# Patient Record
Sex: Male | Born: 1965 | Race: Black or African American | Hispanic: No | Marital: Married | Smoking: Never smoker
Health system: Southern US, Community
[De-identification: ages and names within clinical notes are randomized; demographics above are authoritative.]

## PROBLEM LIST (undated history)

## (undated) DIAGNOSIS — E8881 Metabolic syndrome: Secondary | ICD-10-CM

## (undated) DIAGNOSIS — R319 Hematuria, unspecified: Secondary | ICD-10-CM

## (undated) DIAGNOSIS — E119 Type 2 diabetes mellitus without complications: Secondary | ICD-10-CM

## (undated) DIAGNOSIS — I82409 Acute embolism and thrombosis of unspecified deep veins of unspecified lower extremity: Secondary | ICD-10-CM

## (undated) DIAGNOSIS — I2699 Other pulmonary embolism without acute cor pulmonale: Secondary | ICD-10-CM

## (undated) DIAGNOSIS — I10 Essential (primary) hypertension: Secondary | ICD-10-CM

## (undated) HISTORY — DX: Hematuria, unspecified: R31.9

## (undated) HISTORY — DX: Essential (primary) hypertension: I10

## (undated) HISTORY — DX: Other pulmonary embolism without acute cor pulmonale: I26.99

## (undated) HISTORY — DX: Type 2 diabetes mellitus without complications: E11.9

## (undated) HISTORY — PX: NO PAST SURGERIES: SHX2092

---

## 1999-08-05 ENCOUNTER — Emergency Department (HOSPITAL_COMMUNITY): Admission: EM | Admit: 1999-08-05 | Discharge: 1999-08-05 | Payer: Self-pay | Admitting: Emergency Medicine

## 2005-01-24 ENCOUNTER — Ambulatory Visit: Payer: Self-pay | Admitting: Family Medicine

## 2005-02-22 ENCOUNTER — Ambulatory Visit: Payer: Self-pay | Admitting: Family Medicine

## 2005-02-25 ENCOUNTER — Ambulatory Visit: Payer: Self-pay | Admitting: Family Medicine

## 2005-07-26 ENCOUNTER — Ambulatory Visit: Payer: Self-pay | Admitting: Family Medicine

## 2005-08-02 ENCOUNTER — Ambulatory Visit: Payer: Self-pay | Admitting: Family Medicine

## 2005-08-19 ENCOUNTER — Ambulatory Visit: Payer: Self-pay | Admitting: Family Medicine

## 2005-09-05 ENCOUNTER — Ambulatory Visit: Payer: Self-pay | Admitting: Family Medicine

## 2005-09-20 ENCOUNTER — Ambulatory Visit: Payer: Self-pay | Admitting: Family Medicine

## 2005-10-31 ENCOUNTER — Ambulatory Visit: Payer: Self-pay | Admitting: Family Medicine

## 2005-10-31 ENCOUNTER — Encounter: Admission: RE | Admit: 2005-10-31 | Discharge: 2005-10-31 | Payer: Self-pay | Admitting: Family Medicine

## 2005-12-20 ENCOUNTER — Ambulatory Visit: Payer: Self-pay

## 2005-12-20 ENCOUNTER — Ambulatory Visit: Payer: Self-pay | Admitting: Family Medicine

## 2006-01-29 ENCOUNTER — Ambulatory Visit: Payer: Self-pay | Admitting: Family Medicine

## 2006-07-22 ENCOUNTER — Ambulatory Visit: Payer: Self-pay | Admitting: Family Medicine

## 2006-07-28 ENCOUNTER — Ambulatory Visit: Payer: Self-pay | Admitting: Family Medicine

## 2006-08-28 ENCOUNTER — Ambulatory Visit: Payer: Self-pay | Admitting: Family Medicine

## 2006-12-05 ENCOUNTER — Ambulatory Visit: Payer: Self-pay | Admitting: Family Medicine

## 2008-02-15 ENCOUNTER — Ambulatory Visit: Payer: Self-pay | Admitting: Family Medicine

## 2008-02-15 DIAGNOSIS — Z86718 Personal history of other venous thrombosis and embolism: Secondary | ICD-10-CM | POA: Insufficient documentation

## 2008-02-15 DIAGNOSIS — E785 Hyperlipidemia, unspecified: Secondary | ICD-10-CM | POA: Insufficient documentation

## 2008-07-11 DIAGNOSIS — H60399 Other infective otitis externa, unspecified ear: Secondary | ICD-10-CM | POA: Insufficient documentation

## 2008-07-13 ENCOUNTER — Ambulatory Visit: Payer: Self-pay | Admitting: Family Medicine

## 2008-08-26 ENCOUNTER — Telehealth: Payer: Self-pay | Admitting: Internal Medicine

## 2008-09-01 ENCOUNTER — Ambulatory Visit: Payer: Self-pay | Admitting: Family Medicine

## 2008-09-01 LAB — CONVERTED CEMR LAB
ALT: 25 units/L (ref 0–53)
AST: 24 units/L (ref 0–37)
Albumin: 4 g/dL (ref 3.5–5.2)
Alkaline Phosphatase: 52 units/L (ref 39–117)
BUN: 12 mg/dL (ref 6–23)
Basophils Absolute: 0 10*3/uL (ref 0.0–0.1)
Basophils Relative: 0.5 % (ref 0.0–3.0)
Bilirubin Urine: NEGATIVE
Bilirubin, Direct: 0.1 mg/dL (ref 0.0–0.3)
CO2: 31 meq/L (ref 19–32)
Calcium: 9.6 mg/dL (ref 8.4–10.5)
Chloride: 100 meq/L (ref 96–112)
Cholesterol: 179 mg/dL (ref 0–200)
Creatinine, Ser: 1.3 mg/dL (ref 0.4–1.5)
Eosinophils Absolute: 0.4 10*3/uL (ref 0.0–0.7)
Eosinophils Relative: 6.5 % — ABNORMAL HIGH (ref 0.0–5.0)
GFR calc Af Amer: 78 mL/min
GFR calc non Af Amer: 65 mL/min
Glucose, Bld: 94 mg/dL (ref 70–99)
Glucose, Urine, Semiquant: NEGATIVE
HCT: 38.2 % — ABNORMAL LOW (ref 39.0–52.0)
HDL: 49.8 mg/dL (ref >39.0)
Hemoglobin: 12.8 g/dL — ABNORMAL LOW (ref 13.0–17.0)
Ketones, urine, test strip: NEGATIVE
LDL Cholesterol: 110 mg/dL — ABNORMAL HIGH (ref 0–99)
Lymphocytes Relative: 47.2 % — ABNORMAL HIGH (ref 12.0–46.0)
MCHC: 33.5 g/dL (ref 30.0–36.0)
MCV: 85.7 fL (ref 78.0–100.0)
Monocytes Absolute: 0.4 10*3/uL (ref 0.1–1.0)
Monocytes Relative: 7.5 % (ref 3.0–12.0)
Neutro Abs: 2.1 10*3/uL (ref 1.4–7.7)
Neutrophils Relative %: 38.3 % — ABNORMAL LOW (ref 43.0–77.0)
Nitrite: NEGATIVE
PSA: 0.67 ng/mL (ref 0.10–4.00)
Platelets: 308 10*3/uL (ref 150–400)
Potassium: 3.5 meq/L (ref 3.5–5.1)
Protein, U semiquant: NEGATIVE
RBC: 4.46 M/uL (ref 4.22–5.81)
RDW: 13.2 % (ref 11.5–14.6)
Sodium: 137 meq/L (ref 135–145)
Specific Gravity, Urine: 1.015
TSH: 1 microintl units/mL (ref 0.35–5.50)
Total Bilirubin: 1 mg/dL (ref 0.3–1.2)
Total CHOL/HDL Ratio: 3.6
Total Protein: 7 g/dL (ref 6.0–8.3)
Triglycerides: 94 mg/dL (ref 0–149)
Urobilinogen, UA: 0.2
VLDL: 19 mg/dL (ref 0–40)
WBC Urine, dipstick: NEGATIVE
WBC: 5.6 10*3/uL (ref 4.5–10.5)
pH: 6.5

## 2008-09-08 ENCOUNTER — Ambulatory Visit: Payer: Self-pay | Admitting: Family Medicine

## 2009-02-07 ENCOUNTER — Telehealth: Payer: Self-pay | Admitting: Family Medicine

## 2009-02-10 ENCOUNTER — Ambulatory Visit: Payer: Self-pay | Admitting: Family Medicine

## 2009-02-10 DIAGNOSIS — R609 Edema, unspecified: Secondary | ICD-10-CM

## 2009-02-20 ENCOUNTER — Ambulatory Visit: Payer: Self-pay | Admitting: Vascular Surgery

## 2009-02-20 ENCOUNTER — Encounter: Payer: Self-pay | Admitting: Family Medicine

## 2009-03-02 ENCOUNTER — Ambulatory Visit: Payer: Self-pay | Admitting: Vascular Surgery

## 2010-03-01 ENCOUNTER — Ambulatory Visit: Payer: Self-pay | Admitting: Family Medicine

## 2010-03-05 ENCOUNTER — Ambulatory Visit: Payer: Self-pay | Admitting: Family Medicine

## 2010-03-21 ENCOUNTER — Ambulatory Visit: Payer: Self-pay | Admitting: Family Medicine

## 2010-03-21 LAB — CONVERTED CEMR LAB
ALT: 22 units/L (ref 0–53)
AST: 26 units/L (ref 0–37)
Albumin: 4.2 g/dL (ref 3.5–5.2)
Alkaline Phosphatase: 69 units/L (ref 39–117)
BUN: 21 mg/dL (ref 6–23)
Basophils Absolute: 0.1 10*3/uL (ref 0.0–0.1)
Basophils Relative: 1 % (ref 0.0–3.0)
Bilirubin Urine: NEGATIVE
Bilirubin, Direct: 0.2 mg/dL (ref 0.0–0.3)
Blood in Urine, dipstick: NEGATIVE
CO2: 28 meq/L (ref 19–32)
Calcium: 9.5 mg/dL (ref 8.4–10.5)
Chloride: 103 meq/L (ref 96–112)
Cholesterol: 165 mg/dL (ref 0–200)
Creatinine, Ser: 1.4 mg/dL (ref 0.4–1.5)
Eosinophils Absolute: 0.3 10*3/uL (ref 0.0–0.7)
Eosinophils Relative: 5.5 % — ABNORMAL HIGH (ref 0.0–5.0)
GFR calc non Af Amer: 69.33 mL/min (ref >60)
Glucose, Bld: 115 mg/dL — ABNORMAL HIGH (ref 70–99)
Glucose, Urine, Semiquant: NEGATIVE
HCT: 37.4 % — ABNORMAL LOW (ref 39.0–52.0)
HDL: 51.9 mg/dL (ref >39.00)
Hemoglobin: 12.5 g/dL — ABNORMAL LOW (ref 13.0–17.0)
Ketones, urine, test strip: NEGATIVE
LDL Cholesterol: 94 mg/dL (ref 0–99)
Lymphocytes Relative: 40.7 % (ref 12.0–46.0)
Lymphs Abs: 2.4 10*3/uL (ref 0.7–4.0)
MCHC: 33.3 g/dL (ref 30.0–36.0)
MCV: 84.5 fL (ref 78.0–100.0)
Monocytes Absolute: 0.5 10*3/uL (ref 0.1–1.0)
Monocytes Relative: 8.7 % (ref 3.0–12.0)
Neutro Abs: 2.6 10*3/uL (ref 1.4–7.7)
Neutrophils Relative %: 44.1 % (ref 43.0–77.0)
Nitrite: NEGATIVE
PSA: 0.75 ng/mL (ref 0.10–4.00)
Platelets: 397 10*3/uL (ref 150.0–400.0)
Potassium: 4.6 meq/L (ref 3.5–5.1)
RBC: 4.42 M/uL (ref 4.22–5.81)
RDW: 14.7 % — ABNORMAL HIGH (ref 11.5–14.6)
Sodium: 139 meq/L (ref 135–145)
Specific Gravity, Urine: 1.025
TSH: 0.51 microintl units/mL (ref 0.35–5.50)
Total Bilirubin: 1.2 mg/dL (ref 0.3–1.2)
Total CHOL/HDL Ratio: 3
Total Protein: 7.3 g/dL (ref 6.0–8.3)
Triglycerides: 98 mg/dL (ref 0.0–149.0)
Urobilinogen, UA: 0.2
VLDL: 19.6 mg/dL (ref 0.0–40.0)
WBC Urine, dipstick: NEGATIVE
WBC: 5.9 10*3/uL (ref 4.5–10.5)
pH: 5.5

## 2010-03-28 ENCOUNTER — Ambulatory Visit: Payer: Self-pay | Admitting: Family Medicine

## 2010-06-13 ENCOUNTER — Ambulatory Visit: Payer: Self-pay | Admitting: Family Medicine

## 2010-10-19 ENCOUNTER — Ambulatory Visit
Admission: RE | Admit: 2010-10-19 | Discharge: 2010-10-19 | Payer: Self-pay | Source: Home / Self Care | Attending: Family Medicine | Admitting: Family Medicine

## 2010-10-19 LAB — CONVERTED CEMR LAB
Bilirubin Urine: NEGATIVE
Nitrite: NEGATIVE
Urobilinogen, UA: 0.2
WBC Urine, dipstick: NEGATIVE

## 2010-10-26 ENCOUNTER — Telehealth: Payer: Self-pay | Admitting: Family Medicine

## 2010-10-26 ENCOUNTER — Other Ambulatory Visit: Payer: Self-pay | Admitting: Family Medicine

## 2010-10-26 ENCOUNTER — Ambulatory Visit
Admission: RE | Admit: 2010-10-26 | Discharge: 2010-10-26 | Payer: Self-pay | Source: Home / Self Care | Attending: Family Medicine | Admitting: Family Medicine

## 2010-10-26 LAB — MICROALBUMIN / CREATININE URINE RATIO
Creatinine,U: 101.8 mg/dL
Microalb Creat Ratio: 2.9 mg/g (ref 0.0–30.0)
Microalb, Ur: 3 mg/dL — ABNORMAL HIGH (ref 0.0–1.9)

## 2010-10-26 LAB — BASIC METABOLIC PANEL
BUN: 18 mg/dL (ref 6–23)
CO2: 28 mEq/L (ref 19–32)
Calcium: 10 mg/dL (ref 8.4–10.5)
Chloride: 92 mEq/L — ABNORMAL LOW (ref 96–112)
Creatinine, Ser: 1.3 mg/dL (ref 0.4–1.5)
GFR: 76.49 mL/min (ref >60.00)
Glucose, Bld: 267 mg/dL — ABNORMAL HIGH (ref 70–99)
Potassium: 4.7 mEq/L (ref 3.5–5.1)
Sodium: 131 mEq/L — ABNORMAL LOW (ref 135–145)

## 2010-10-26 LAB — HEMOGLOBIN A1C: Hgb A1c MFr Bld: 11.2 % — ABNORMAL HIGH (ref 4.6–6.5)

## 2010-11-01 ENCOUNTER — Ambulatory Visit
Admission: RE | Admit: 2010-11-01 | Discharge: 2010-11-01 | Payer: Self-pay | Source: Home / Self Care | Attending: Family Medicine | Admitting: Family Medicine

## 2010-11-07 ENCOUNTER — Telehealth: Payer: Self-pay | Admitting: Family Medicine

## 2010-11-15 ENCOUNTER — Ambulatory Visit
Admission: RE | Admit: 2010-11-15 | Discharge: 2010-11-15 | Payer: Self-pay | Source: Home / Self Care | Attending: Family Medicine | Admitting: Family Medicine

## 2010-11-20 NOTE — Assessment & Plan Note (Signed)
Summary: BACK PAINS/CONGESTION/RCD   Vital Signs:  Patient profile:   45 year old male Height:      72 inches Weight:      254 pounds BMI:     34.57 Temp:     98.3 degrees F oral BP sitting:   180 / 100  (left arm) Cuff size:   regular  Vitals Entered By: Kern Reap CMA Duncan Dull) (Mar 01, 2010 9:49 AM) CC: lower back pain and sinus pressure   CC:  lower back pain and sinus pressure.  History of Present Illness: Joshua Castaneda is a 45 year old, married male, nonsmoker, who comes in today for evaluation of low back pain.  He states over this past weekend.  He was doing some yard work and had some low back pain, but it was minimal.  Yesterday after work he bent over at home to lift something up and developed a sudden onset of severe pain.  He says his right and left lumbar.  It does not radiate down his legs.  He describes the pain as constant, sharp, sometimes dull, an 8 on a scale of one to 10, he did not sleep last night because of severe pain.  Five years ago.  He had an episode like this which resolved with bed rest and medication.  Review of systems negative specifically, bowel, bladder function, normal  Allergies: 1)  ! Shrimp Flavor (Flavoring Agent)  Social History: Reviewed history from 02/15/2008 and no changes required. Occupation:[employer] Never Smoked Alcohol use-no Drug use-no Regular exercise-yes  Review of Systems      See HPI  Physical Exam  General:  Well-developed,well-nourished,in no acute distress; alert,appropriate and cooperative throughout examination Msk:  No deformity or scoliosis noted of thoracic or lumbar spine.   Pulses:  R and L carotid,radial,femoral,dorsalis pedis and posterior tibial pulses are full and equal bilaterally Extremities:  No clubbing, cyanosis, edema, or deformity noted with normal full range of motion of all joints.   Neurologic:  No cranial nerve deficits noted. Station and gait are normal. Plantar reflexes are  down-going bilaterally. DTRs are symmetrical throughout. Sensory, motor and coordinative functions appear intact.   Impression & Recommendations:  Problem # 1:  LOW BACK PAIN, ACUTE (ICD-724.2) Assessment Deteriorated  His updated medication list for this problem includes:    Adult Aspirin Ec Low Strength 81 Mg Tbec (Aspirin) ..... Once daily    Flexeril 10 Mg Tabs (Cyclobenzaprine hcl) .Marland Kitchen... Take 1 tablet by mouth three times a day    Vicodin Es 7.5-750 Mg Tabs (Hydrocodone-acetaminophen) .Marland Kitchen... Take 1 tablet by mouth three times a day  Complete Medication List: 1)  Lisinopril 40 Mg Tabs (Lisinopril) .... Once daily 2)  Hydrochlorothiazide 25 Mg Tabs (Hydrochlorothiazide) .... Once daily 3)  Simvastatin 20 Mg Tabs (Simvastatin) .... At bedtime 4)  Adult Aspirin Ec Low Strength 81 Mg Tbec (Aspirin) .... Once daily 5)  Nasonex 50 Mcg/act Susp (Mometasone furoate) .... One spray per nostrile once daily as needed 6)  Furosemide 20 Mg Tabs (Furosemide) .... Take 1 tablet by mouth every morning 7)  Viagra 50 Mg Tabs (Sildenafil citrate) .... Uad 8)  Flexeril 10 Mg Tabs (Cyclobenzaprine hcl) .... Take 1 tablet by mouth three times a day 9)  Vicodin Es 7.5-750 Mg Tabs (Hydrocodone-acetaminophen) .... Take 1 tablet by mouth three times a day  Patient Instructions: 1)  take Motrin, 600 mg 3 times a day with food.  Also, Vicodin, and Flexeril one half to one tablet of  each 3 times a day as needed for pain.  Bed rest at home.  Saturday/Sunday...... walk.......... lie down................. walk......... lie down.  No sitting return Monday for follow-up Prescriptions: VICODIN ES 7.5-750 MG TABS (HYDROCODONE-ACETAMINOPHEN) Take 1 tablet by mouth three times a day  #30 x 1   Entered and Authorized by:   Jeffrey A Todd MD   Signed by:   Jeffrey A Todd MD on 03/01/2010   Method used:   Printed then faxed to ...       [facility]  [address] #[id number]* (retail)       [address]       [place], [place]   [address]       Ph: [phone] or [phone]       Fax: [phone]   RxID:   [id number] FLEXERIL 10 MG TABS (CYCLOBENZAPRINE HCL) Take 1 tablet by mouth three times a day  #30 x 1   Entered and Authorized by:   Jeffrey A Todd MD   Signed by:   Jeffrey A Todd MD on 03/01/2010   Method used:   Printed then faxed to ...       [facility]  [address] #[id number]* (retail)       [address] [place], [place]  [address]       Ph: [phone] or [phone]       Fax: [phone]   RxID:   [id number]

## 2010-11-20 NOTE — Assessment & Plan Note (Signed)
Summary: fu per doc/njr   Vital Signs:  Patient profile:   45 year old male Temp:     98.1 degrees F oral BP sitting:   156 / 98  (left arm) Cuff size:   regular  Vitals Entered By: Kern Reap CMA Duncan Dull) (Mar 05, 2010 8:47 AM) CC: follow-up visit for back pain   CC:  follow-up visit for back pain.  History of Present Illness: Joshua Castaneda is a 45 year old male, nonsmoker, who comes in today for follow-up of low back pain.  We saw Monday the 12th with severe back pain.  Neurologic exam was negative.  He was felt to have lumbar disk disease with no spinal cord involvement.  He was placed at bedrest started on Flexeril, Vicodin, and has improved over the weekend.  He now states his pain is 75% decreased  Review of systems negative  Allergies: 1)  ! Shrimp Flavor (Flavoring Agent)  Past History:  Past medical, surgical, family and social histories (including risk factors) reviewed for relevance to current acute and chronic problems.  Past Medical History: Reviewed history from 02/15/2008 and no changes required. DVT, hx of Hyperlipidemia Hypertension  Family History: Reviewed history from 02/15/2008 and no changes required. father diabetes , type 2, heart murmurmother died 81, cancer, hypertension  two brothers, one has allergies once, obesetwo sisters, one has hypertension.  The other has hypertension also.  Social History: Reviewed history from 02/15/2008 and no changes required. Occupation:[employer] Single Never Smoked Alcohol use-no Drug use-no Regular exercise-yes  Review of Systems      See HPI  Physical Exam  General:  Well-developed,well-nourished,in no acute distress; alert,appropriate and cooperative throughout examination Msk:  No deformity or scoliosis noted of thoracic or lumbar spine.   Pulses:  R and L carotid,radial,femoral,dorsalis pedis and posterior tibial pulses are full and equal bilaterally Extremities:  No clubbing, cyanosis, edema,  or deformity noted with normal full range of motion of all joints.   Neurologic:  No cranial nerve deficits noted. Station and gait are normal. Plantar reflexes are down-going bilaterally. DTRs are symmetrical throughout. Sensory, motor and coordinative functions appear intact.   Impression & Recommendations:  Problem # 1:  LOW BACK PAIN, ACUTE (ICD-724.2) Assessment Improved  His updated medication list for this problem includes:    Adult Aspirin Ec Low Strength 81 Mg Tbec (Aspirin) ..... Once daily    Flexeril 10 Mg Tabs (Cyclobenzaprine hcl) .Marland Kitchen... Take 1 tablet by mouth three times a day    Vicodin Es 7.5-750 Mg Tabs (Hydrocodone-acetaminophen) .Marland Kitchen... Take 1 tablet by mouth three times a day  Complete Medication List: 1)  Lisinopril 40 Mg Tabs (Lisinopril) .... Once daily 2)  Hydrochlorothiazide 25 Mg Tabs (Hydrochlorothiazide) .... Once daily 3)  Simvastatin 20 Mg Tabs (Simvastatin) .... At bedtime 4)  Adult Aspirin Ec Low Strength 81 Mg Tbec (Aspirin) .... Once daily 5)  Nasonex 50 Mcg/act Susp (Mometasone furoate) .... One spray per nostrile once daily as needed 6)  Furosemide 20 Mg Tabs (Furosemide) .... Take 1 tablet by mouth every morning 7)  Viagra 50 Mg Tabs (Sildenafil citrate) .... Uad 8)  Flexeril 10 Mg Tabs (Cyclobenzaprine hcl) .... Take 1 tablet by mouth three times a day 9)  Vicodin Es 7.5-750 Mg Tabs (Hydrocodone-acetaminophen) .... Take 1 tablet by mouth three times a day  Patient Instructions: 1)  continue to avoid sitting, walk........ lie down. 2)  Return to work on Wednesday. 3)   stretching exercises twice daily. 4)  He  may take a half of a flexural and/or Vicodin at bedtime as needed. 5)  Return p.r.n.

## 2010-11-20 NOTE — Assessment & Plan Note (Signed)
Summary: knee pain//alp   Vital Signs:  Patient profile:   45 year old male Weight:      247 pounds Temp:     98.6 degrees F oral BP sitting:   140 / 90  (left arm) Cuff size:   regular  Vitals Entered By: Kern Reap CMA Duncan Dull) (June 13, 2010 11:14 AM) CC: left knee pain   CC:  left knee pain.  History of Present Illness: Joshua Castaneda is a 45 year old male, who comes in today with a two week history of pain in the medial side of his left knee.  About two weeks ago.  He got of his truck and twisted his left knee.  Two days later, he noticed some soreness in the medial portion of the knee.  It's not been swollen nor does it locked.  No history of previous trauma  Allergies: 1)  ! Shrimp Flavor (Flavoring Agent)  Past History:  Past medical, surgical, family and social histories (including risk factors) reviewed for relevance to current acute and chronic problems.  Past Medical History: Reviewed history from 02/15/2008 and no changes required. DVT, hx of Hyperlipidemia Hypertension  Family History: Reviewed history from 02/15/2008 and no changes required. father diabetes , type 2, heart murmurmother died 73, cancer, hypertension  two brothers, one has allergies once, obesetwo sisters, one has hypertension.  The other has hypertension also. Father: deceased Mother:  Siblings:   Social History: Reviewed history from 02/15/2008 and no changes required. Occupation:[employer] Single Never Smoked Alcohol use-no Drug use-no Regular exercise-yes  Review of Systems      See HPI  Physical Exam  General:  Well-developed,well-nourished,in no acute distress; alert,appropriate and cooperative throughout examination Msk:  both knees appear normal.  No swelling.  No temperature change.  Left knee.  There is some tenderness along the medial joint line ligaments.  Intact   Problems:  Medical Problems Added: 1)  Dx of Internal Derangement, Knee, Unspec.   (ICD-717.9)  Impression & Recommendations:  Problem # 1:  INTERNAL DERANGEMENT, KNEE, UNSPEC. (ICD-717.9) Assessment New  Complete Medication List: 1)  Lisinopril 40 Mg Tabs (Lisinopril) .... Once daily 2)  Hydrochlorothiazide 25 Mg Tabs (Hydrochlorothiazide) .... Once daily 3)  Simvastatin 20 Mg Tabs (Simvastatin) .... At bedtime 4)  Adult Aspirin Ec Low Strength 81 Mg Tbec (Aspirin) .... Once daily 5)  Nasonex 50 Mcg/act Susp (Mometasone furoate) .... One spray per nostrile once daily as needed 6)  Furosemide 20 Mg Tabs (Furosemide) .... Take 1 tablet by mouth every morning 7)  Viagra 50 Mg Tabs (Sildenafil citrate) .... Uad  Patient Instructions: 1)  continue normal activities.  In the evening elevate the leg, ice it for about 30 minutes <  bedtime or elevation, ice, anytime it  gets real sore................  u  can also take Motrin 600 mg twice a day with food as needed 2)  If the pain does not go away or you develop an effusion or locking.  Called Dr. Norlene Campbell, orthopedist

## 2010-11-20 NOTE — Letter (Signed)
Summary: Out of Work  [facility] [employer]  [address]   [place], [place] [address]   Phone: [phone]  Fax: [phone]    Mar 01, 2010   Employee:  Burnard Leigh    To Whom It May Concern:   For Medical reasons, please excuse the above named employee from work for the following dates:  Start:   Mar 01, 2010  End:    If you need additional information, please feel free to contact our office.         Sincerely,    Kelle Darting, MD

## 2010-11-20 NOTE — Assessment & Plan Note (Signed)
Summary: cpx/njr   Vital Signs:  Patient profile:   45 year old male Height:      71.25 inches Weight:      250 pounds BMI:     34.75 Temp:     98.2 degrees F oral BP sitting:   130 / 80  (left arm) Cuff size:   regular  Vitals Entered By: Kern Reap CMA Duncan Dull) (March 28, 2010 10:09 AM) CC: cpx   CC:  cpx.  History of Present Illness: Joshua Castaneda is a 45 year old male, who comes in today for evaluation of hypertension, hyperlipidemia, allergic rhinitis, venous insufficiency.  His hypertension is treated with lisinopril 40 mg daily Advicor, thiazide 25 mg daily.  BP 130/80.  Hyperlipidemia.  Is treated with simvastatin 20 mg nightly lipids are normal.  His allergic rhinitis is treated with Flonase nasal spray.  His venous insufficiency from his previous DVT is treated with Lasix 20 mg daily.  He is up on his health maintenance activities.  Tetanus 2006  Allergies: 1)  ! Shrimp Flavor (Flavoring Agent)  Past History:  Past medical, surgical, family and social histories (including risk factors) reviewed, and no changes noted (except as noted below).  Past Medical History: Reviewed history from 02/15/2008 and no changes required. DVT, hx of Hyperlipidemia Hypertension  Family History: Reviewed history from 02/15/2008 and no changes required. father diabetes , type 2, heart murmurmother died 5, cancer, hypertension  two brothers, one has allergies once, obesetwo sisters, one has hypertension.  The other has hypertension also.  Social History: Reviewed history from 02/15/2008 and no changes required. Occupation:[employer] Single Never Smoked Alcohol use-no Drug use-no Regular exercise-yes  Review of Systems      See HPI  Physical Exam  General:  Well-developed,well-nourished,in no acute distress; alert,appropriate and cooperative throughout examination Head:  Normocephalic and atraumatic without obvious abnormalities. No apparent alopecia or  balding. Eyes:  No corneal or conjunctival inflammation noted. EOMI. Perrla. Funduscopic exam benign, without hemorrhages, exudates or papilledema. Vision grossly normal. Ears:  External ear exam shows no significant lesions or deformities.  Otoscopic examination reveals clear canals, tympanic membranes are intact bilaterally without bulging, retraction, inflammation or discharge. Hearing is grossly normal bilaterally. Nose:  External nasal examination shows no deformity or inflammation. Nasal mucosa are pink and moist without lesions or exudates. Mouth:  Oral mucosa and oropharynx without lesions or exudates.  Teeth in good repair. Neck:  No deformities, masses, or tenderness noted. Chest Wall:  No deformities, masses, tenderness or gynecomastia noted. Breasts:  No masses or gynecomastia noted Lungs:  Normal respiratory effort, chest expands symmetrically. Lungs are clear to auscultation, no crackles or wheezes. Heart:  Normal rate and regular rhythm. S1 and S2 normal without gallop, murmur, click, rub or other extra sounds. Abdomen:  Bowel sounds positive,abdomen soft and non-tender without masses, organomegaly or hernias noted. Rectal:  No external abnormalities noted. Normal sphincter tone. No rectal masses or tenderness. Genitalia:  Testes bilaterally descended without nodularity, tenderness or masses. No scrotal masses or lesions. No penis lesions or urethral discharge. Prostate:  Prostate gland firm and smooth, no enlargement, nodularity, tenderness, mass, asymmetry or induration. Msk:  No deformity or scoliosis noted of thoracic or lumbar spine.   Pulses:  R and L carotid,radial,femoral,dorsalis pedis and posterior tibial pulses are full and equal bilaterally Extremities:  No clubbing, cyanosis, edema, or deformity noted with normal full range of motion of all joints.   Neurologic:  No cranial nerve deficits noted. Station and gait  are normal. Plantar reflexes are down-going bilaterally.  DTRs are symmetrical throughout. Sensory, motor and coordinative functions appear intact. Skin:  Intact without suspicious lesions or rashes Cervical Nodes:  No lymphadenopathy noted Axillary Nodes:  No palpable lymphadenopathy Inguinal Nodes:  No significant adenopathy Psych:  Cognition and judgment appear intact. Alert and cooperative with normal attention span and concentration. No apparent delusions, illusions, hallucinations   Impression & Recommendations:  Problem # 1:  Preventive Health Care (ICD-V70.0) Assessment Unchanged  Problem # 2:  PERIPHERAL EDEMA (ICD-782.3) Assessment: Improved  His updated medication list for this problem includes:    Hydrochlorothiazide 25 Mg Tabs (Hydrochlorothiazide) ..... Once daily    Furosemide 20 Mg Tabs (Furosemide) .Marland Kitchen... Take 1 tablet by mouth every morning  Orders: Prescription Created Electronically [phone])  Problem # 3:  HYPERTENSION (ICD-401.9) Assessment: Improved  His updated medication list for this problem includes:    Lisinopril 40 Mg Tabs (Lisinopril) ..... Once daily    Hydrochlorothiazide 25 Mg Tabs (Hydrochlorothiazide) ..... Once daily    Furosemide 20 Mg Tabs (Furosemide) .Marland Kitchen... Take 1 tablet by mouth every morning  Orders: Prescription Created Electronically [id number]) EKG w/ Interpretation ([address])  Problem # 4:  HYPERLIPIDEMIA (ICD-272.4) Assessment: Improved  His updated medication list for this problem includes:    Simvastatin 20 Mg Tabs (Simvastatin) .Marland Kitchen... At bedtime  Orders: Prescription Created Electronically [id number]) EKG w/ Interpretation ([address])  Complete Medication List: 1)  Lisinopril 40 Mg Tabs (Lisinopril) .... Once daily 2)  Hydrochlorothiazide 25 Mg Tabs (Hydrochlorothiazide) .... Once daily 3)  Simvastatin 20 Mg Tabs (Simvastatin) .... At bedtime 4)  Adult Aspirin Ec Low Strength 81 Mg Tbec (Aspirin) .... Once daily 5)  Nasonex 50 Mcg/act Susp (Mometasone furoate) .... One spray per nostrile once  daily as needed 6)  Furosemide 20 Mg Tabs (Furosemide) .... Take 1 tablet by mouth every morning 7)  Viagra 50 Mg Tabs (Sildenafil citrate) .... Uad  Patient Instructions: 1)  Please schedule a follow-up appointment in 1 year. 2)  It is important that you exercise regularly at least 20 minutes 5 times a week. If you develop chest pain, have severe difficulty breathing, or feel very tired , stop exercising immediately and seek medical attention. 3)  You need to lose weight. Consider a lower calorie diet and regular exercise.  4)  Take an Aspirin every day. Prescriptions: FUROSEMIDE 20 MG TABS (FUROSEMIDE) Take 1 tablet by mouth every morning  #100 x 3   Entered and Authorized by:   Roderick Pee MD   Signed by:   Roderick Pee MD on 03/28/2010   Method used:   Electronically to        [facility] [phone]* (retail)       [address] [place], [place]  [address]       Ph: [phone] or [phone]       Fax: [phone]   RxID:   [id number] NASONEX 50 MCG/ACT SUSP (MOMETASONE FUROATE) one spray per nostrile once daily as needed  #3 x 3   Entered and Authorized by:   Roderick Pee MD   Signed by:   Roderick Pee MD on 03/28/2010   Method used:   Electronically to        [facility] [phone]* (retail)       [address] [place], [place]  [address]  Ph: [phone] or [phone]       Fax: [phone]   RxID:   [id number] SIMVASTATIN 20 MG  TABS (SIMVASTATIN) at bedtime  #100 x 3   Entered and Authorized by:   Roderick Pee MD   Signed by:   Roderick Pee MD on 03/28/2010   Method used:   Electronically to        [facility] [phone]* (retail)       [address] [place], [place]  [address]       Ph: [phone] or [phone]       Fax: [phone]   RxID:   [id number] HYDROCHLOROTHIAZIDE 25 MG  TABS (HYDROCHLOROTHIAZIDE) once daily  #100 x 3   Entered and Authorized by:   Roderick Pee MD   Signed by:   Roderick Pee  MD on 03/28/2010   Method used:   Electronically to        [facility] [phone]* (retail)       [address] [place], [place]  [address]       Ph: [phone] or [phone]       Fax: [phone]   RxID:   [id number] LISINOPRIL 40 MG  TABS (LISINOPRIL) once daily  #100 x 3   Entered and Authorized by:   Roderick Pee MD   Signed by:   Roderick Pee MD on 03/28/2010   Method used:   Electronically to        [facility] [phone]* (retail)       [address] [place], [place]  [address]       Ph: [phone] or [phone]       Fax: [phone]   RxID:   [id number]

## 2010-11-20 NOTE — Letter (Signed)
Summary: [facility]  [facility]   Imported By: Maryln Gottron 11/15/2009 10:46:50  _____________________________________________________________________  External Attachment:    Type:   Image     Comment:   External Document

## 2010-11-22 NOTE — Progress Notes (Signed)
  Phone Note Outgoing Call   Summary of Call: blood sugar 267, A1c11 .2%............ add Glucotrol, 5 mg b.i.d. two metformin 500 mg in the morning and 250 prior to his evening meal.  Follow-up in one week if we don't see a significant drop in his  sugar then I explained to him.  We will need to start his insulin next week Initial call taken by: Roderick Pee MD,  October 26, 2010 5:49 PM    New/Updated Medications: GLUCOTROL 5 MG TABS (GLIPIZIDE) Take 1 tablet by mouth two times a day Prescriptions: GLUCOTROL 5 MG TABS (GLIPIZIDE) Take 1 tablet by mouth two times a day  #200 x 3   Entered and Authorized by:   Roderick Pee MD   Signed by:   Roderick Pee MD on 10/26/2010   Method used:   Electronically to        [facility] [phone]* (retail)       [address] [place], [place]  [address]       Ph: [phone] or [phone]       Fax: [phone]   RxID:   [id number]

## 2010-11-22 NOTE — Assessment & Plan Note (Signed)
Summary: 1 wk rov/per dr burchette/njr   Vital Signs:  Patient profile:   45 year old male Weight:      242 pounds Temp:     98.3 degrees F oral BP sitting:   110 / 70  (left arm) Cuff size:   regular  Vitals Entered By: Kern Reap CMA Duncan Dull) (October 26, 2010 9:32 AM) CC: follow-up visit for DM Is Patient Diabetic? Yes Did you bring your meter with you today? Yes CBG Result 287   CC:  follow-up visit for DM.  History of Present Illness: Joshua Castaneda is a 45 year old male, nonsmoker, who comes in today for reevaluation of diabetes.  He was seen by Dr. Caryl Never on December the 30th with marked elevation of his blood sugar.  At that time.  He was started on metformin 500 mg b.i.d. however, he de efcts, and decrease the dose to 500 mg prior to breakfast.  Blood sugars are in the 250 range.  They were up in the 300 range prior.  Review of systems otherwise negative.  Referred to Dr. Kendrick Ranch for eye exam  Allergies: 1)  ! * Shell Fish  Past History:  Past medical, surgical, family and social histories (including risk factors) reviewed for relevance to current acute and chronic problems.  Past Medical History: Reviewed history from 02/15/2008 and no changes required. DVT, hx of Hyperlipidemia Hypertension  Family History: Reviewed history from 06/13/2010 and no changes required. father diabetes , type 2, heart murmurmother died 39, cancer, hypertension  two brothers, one has allergies once, obesetwo sisters, one has hypertension.  The other has hypertension also. Father: deceased Mother:  Siblings:   Social History: Reviewed history from 02/15/2008 and no changes required. Occupation:[employer] Single Never Smoked Alcohol use-no Drug use-no Regular exercise-yes  Review of Systems      See HPI  Physical Exam  General:  Well-developed,well-nourished,in no acute distress; alert,appropriate and cooperative throughout examination   Problems:  Medical  Problems Added: 1)  Dx of Diabetes-type 2  (ICD-250.00)  Impression & Recommendations:  Problem # 1:  DIABETES-TYPE 2 (ICD-250.00) Assessment New  His updated medication list for this problem includes:    Lisinopril 40 Mg Tabs (Lisinopril) ..... Once daily    Adult Aspirin Ec Low Strength 81 Mg Tbec (Aspirin) ..... Once daily    Metformin Hcl 500 Mg Tabs (Metformin hcl) ..... One by mouth two times a day  Orders: TLB-BMP (Basic Metabolic Panel-BMET) (80048-METABOL) TLB-A1C / Hgb A1C (Glycohemoglobin) (83036-A1C) TLB-Microalbumin/Creat Ratio, Urine (82043-MALB)  Complete Medication List: 1)  Lisinopril 40 Mg Tabs (Lisinopril) .... Once daily 2)  Hydrochlorothiazide 25 Mg Tabs (Hydrochlorothiazide) .... Once daily 3)  Simvastatin 20 Mg Tabs (Simvastatin) .... At bedtime 4)  Adult Aspirin Ec Low Strength 81 Mg Tbec (Aspirin) .... Once daily 5)  Nasonex 50 Mcg/act Susp (Mometasone furoate) .... One spray per nostrile once daily as needed 6)  Viagra 50 Mg Tabs (Sildenafil citrate) .... Uad 7)  Metformin Hcl 500 Mg Tabs (Metformin hcl) .... One by mouth two times a day 8)  Accu-chek Aviva Strp (Glucose blood) .... Use once daily for glucose control  dx 250.00 9)  Accu-chek Multiclix Lancets Misc (Lancets) .... Use once daily for glucose control dx 250.00  Other Orders: Capillary Blood Glucose/CBG (95621)  Patient Instructions: 1)  continue the 500-mg dose of metformin prior to breakfast and at a 250-mg dose prior to evening meal. 2)  Continue diet exercise and daily fasting blood sugar checks in  the morning.  Return in two weeks for follow-up. Prescriptions: ACCU-CHEK MULTICLIX LANCETS  MISC (LANCETS) use once daily for glucose control dx 250.00  #100 x 3   Entered by:   Kern Reap CMA (AAMA)   Authorized by:   Roderick Pee MD   Signed by:   Kern Reap CMA (AAMA) on 10/26/2010   Method used:   Electronically to        [facility] [phone]* (retail)       [address] [place], [place]  [address]       Ph: [phone] or [phone]       Fax: [phone]   RxID:   [id number] ACCU-CHEK AVIVA  STRP (GLUCOSE BLOOD) use once daily for glucose control  dx 250.00  #100 x 3   Entered by:   Kern Reap CMA (AAMA)   Authorized by:   Roderick Pee MD   Signed by:   Kern Reap CMA (AAMA) on 10/26/2010   Method used:   Electronically to        [facility] [phone]* (retail)       [address] [place], [place]  [address]       Ph: [phone] or [phone]       Fax: [phone]   RxID:   [id number]    Orders Added: 1)  Capillary Blood Glucose/CBG [26948] 2)  Est. Patient Level III [54627] 3)  TLB-BMP (Basic Metabolic Panel-BMET) [80048-METABOL] 4)  TLB-A1C / Hgb A1C (Glycohemoglobin) [83036-A1C] 5)  TLB-Microalbumin/Creat Ratio, Urine [82043-MALB]

## 2010-11-22 NOTE — Assessment & Plan Note (Signed)
Summary: ?uti//ccm   Vital Signs:  Patient profile:   45 year old male Weight:      243 pounds Temp:     98.1 degrees F oral BP sitting:   110 / 84  (left arm) Cuff size:   large  Vitals Entered By: Sid Falcon LPN (October 19, 2010 3:53 PM) CBG Result 340   History of Present Illness: Seen with polyuria.  Initially pt concerned for possible UTI. Some increased thirst and weight loss as well. Symptoms for past 2 weeks.  Takes HCTZ and thought symptoms related to that. Very strong FH of diabetes in Mo , Fa, and brother. No dysuria such as burning or urethral discharge.  Also has hx of hypertension and hyperlipidemia.  Meds reviewed. Not exercising regularly.  Allergies: 1)  ! * Shell Fish  Past History:  Past Medical History: Last updated: 02/15/2008 DVT, hx of Hyperlipidemia Hypertension  Family History: Last updated: 06/13/2010 father diabetes , type 2, heart murmurmother died 13, cancer, hypertension  two brothers, one has allergies once, obesetwo sisters, one has hypertension.  The other has hypertension also. Father: deceased Mother:  Siblings:   Social History: Last updated: 02/15/2008 Occupation:[employer] Single Never Smoked Alcohol use-no Drug use-no Regular exercise-yes  Risk Factors: Exercise: yes (02/15/2008)  Risk Factors: Smoking Status: never (02/15/2008) PMH-FH-SH reviewed for relevance  Review of Systems       The patient complains of weight loss.  The patient denies anorexia, fever, weight gain, chest pain, syncope, dyspnea on exertion, peripheral edema, prolonged cough, headaches, hemoptysis, abdominal pain, melena, hematochezia, severe indigestion/heartburn, and incontinence.    Physical Exam  General:  Well-developed,well-nourished,in no acute distress; alert,appropriate and cooperative throughout examination Head:  normocephalic and atraumatic.   Eyes:  No corneal or conjunctival inflammation noted. EOMI. Perrla.  Funduscopic exam benign, without hemorrhages, exudates or papilledema. Vision grossly normal. Mouth:  Oral mucosa and oropharynx without lesions or exudates.  Teeth in good repair. Neck:  No deformities, masses, or tenderness noted. Lungs:  Normal respiratory effort, chest expands symmetrically. Lungs are clear to auscultation, no crackles or wheezes. Heart:  Normal rate and regular rhythm. S1 and S2 normal without gallop, murmur, click, rub or other extra sounds. Extremities:  No clubbing, cyanosis, edema, or deformity noted with normal full range of motion of all joints.     Impression & Recommendations:  Problem # 1:  HYPERGLYCEMIA (ICD-790.29) Assessment New pt has 4 hr pp sugar of 340.  New onset diabetes with symptoms consistent with diabetes-polyuria, thirst, and weight loss. Educational material given on CHO reduction.  Home glucose monitor given and instructed in use.  Started metformin 500 mg two times a day and f/u with Dr Tawanna Cooler next week for repeat fasting labs. His updated medication list for this problem includes:    Metformin Hcl 500 Mg Tabs (Metformin hcl) ..... One by mouth two times a day  Problem # 2:  HYPERTENSION (ICD-401.9)  The following medications were removed from the medication list:    Furosemide 20 Mg Tabs (Furosemide) .Marland Kitchen... Take 1 tablet by mouth every morning His updated medication list for this problem includes:    Lisinopril 40 Mg Tabs (Lisinopril) ..... Once daily    Hydrochlorothiazide 25 Mg Tabs (Hydrochlorothiazide) ..... Once daily  Problem # 3:  HEMATURIA UNSPECIFIED (ICD-599.70) Assessment: New pt has hematuria on dipstick of uncertain significance.  No pain or burning with urination. rec repeat at f/u with Dr Tawanna Cooler in one week.  Complete Medication List:  1)  Lisinopril 40 Mg Tabs (Lisinopril) .... Once daily 2)  Hydrochlorothiazide 25 Mg Tabs (Hydrochlorothiazide) .... Once daily 3)  Simvastatin 20 Mg Tabs (Simvastatin) .... At bedtime 4)   Adult Aspirin Ec Low Strength 81 Mg Tbec (Aspirin) .... Once daily 5)  Nasonex 50 Mcg/act Susp (Mometasone furoate) .... One spray per nostrile once daily as needed 6)  Viagra 50 Mg Tabs (Sildenafil citrate) .... Uad 7)  Metformin Hcl 500 Mg Tabs (Metformin hcl) .... One by mouth two times a day  Other Orders: Capillary Blood Glucose/CBG (62130)  Patient Instructions: 1)  Reduce dietary sugars and starches. 2)  Start some gradual exercise. 3)  Drink plenty of water. 4)  Schedule follow up with Dr Tawanna Cooler in one week. Prescriptions: METFORMIN HCL 500 MG TABS (METFORMIN HCL) one by mouth two times a day  #60 x 3   Entered and Authorized by:   Evelena Peat MD   Signed by:   Evelena Peat MD on 10/19/2010   Method used:   Electronically to        [facility] [phone]* (retail)       [address] [place], [place]  [address]       Ph: [phone] or [phone]       Fax: [phone]   RxID:   [id number]    Orders Added: 1)  Capillary Blood Glucose/CBG [41660] 2)  Est. Patient Level IV [63016]    Laboratory Results   Urine Tests    Routine Urinalysis   Color: yellow Appearance: Clear Glucose: 3+   (Normal Range: Negative) Bilirubin: negative   (Normal Range: Negative) Ketone: trace (5)   (Normal Range: Negative) Spec. Gravity: 1.010   (Normal Range: 1.003-1.035) Blood: 2+   (Normal Range: Negative) Protein: negative   (Normal Range: Negative) Urobilinogen: 0.2   (Normal Range: 0-1) Nitrite: negative   (Normal Range: Negative) Leukocyte Esterace: negative   (Normal Range: Negative)    Comments: Rita Ohara  October 19, 2010 3:57 PM   Blood Tests     CBG Random:: 340mg /dL

## 2010-11-22 NOTE — Assessment & Plan Note (Signed)
Summary: 2 WEEK FUP PER DR//CCM   Vital Signs:  Patient profile:   45 year old male Weight:      242 pounds Temp:     97.9 degrees F oral BP sitting:   100 / 60  (left arm) Cuff size:   regular  Vitals Entered By: Romualdo Bolk, CMA (AAMA) (November 01, 2010 4:36 PM) CC: follow-up visit   CC:  follow-up visit.  History of Present Illness: Joshua Castaneda is a 45 year old male, nonsmoker, who comes in today for follow-up of new onset diabetes, type II.  He is currently taking metformin 500 mg b.i.d. and glipizide 5 mg b.i.d. blood sugar fasting is dropped down to 160.  Initially, it was in the 300 range.  His blood pressure has dropped to 100/60.  He is lightheaded when he stands up.  He's also having some blurred vision.  Advised him this was secondary to his diabetes and not to get any eye checkups.  Right now  Current Medications (verified): 1)  Lisinopril 40 Mg  Tabs (Lisinopril) .... Once Daily 2)  Hydrochlorothiazide 25 Mg  Tabs (Hydrochlorothiazide) .... Once Daily 3)  Simvastatin 20 Mg  Tabs (Simvastatin) .... At Bedtime 4)  Adult Aspirin Ec Low Strength 81 Mg  Tbec (Aspirin) .... Once Daily 5)  Nasonex 50 Mcg/act Susp (Mometasone Furoate) .... One Spray Per Nostrile Once Daily As Needed 6)  Metformin Hcl 500 Mg Tabs (Metformin Hcl) .... One By Mouth Two Times A Day 7)  Accu-Chek Aviva  Strp (Glucose Blood) .... Use Once Daily For Glucose Control  Dx 250.00 8)  Accu-Chek Multiclix Lancets  Misc (Lancets) .... Use Once Daily For Glucose Control Dx 250.00 9)  Glucotrol 5 Mg Tabs (Glipizide) .... Take 1 Tablet By Mouth Two Times A Day  Allergies (verified): 1)  ! * Shell Fish  Past History:  Past medical, surgical, family and social histories (including risk factors) reviewed for relevance to current acute and chronic problems.  Past Medical History: Reviewed history from 02/15/2008 and no changes required. DVT, hx of Hyperlipidemia Hypertension  Family  History: Reviewed history from 06/13/2010 and no changes required. father diabetes , type 2, heart murmurmother died 67, cancer, hypertension  two brothers, one has allergies once, obesetwo sisters, one has hypertension.  The other has hypertension also. Father: deceased Mother:  Siblings:   Social History: Reviewed history from 02/15/2008 and no changes required. Occupation:[employer] Single Never Smoked Alcohol use-no Drug use-no Regular exercise-yes  Review of Systems      See HPI  Physical Exam  General:  Well-developed,well-nourished,in no acute distress; alert,appropriate and cooperative throughout examination   Impression & Recommendations:  Problem # 1:  DIABETES-TYPE 2 (ICD-250.00) Assessment Improved  His updated medication list for this problem includes:    Lisinopril 40 Mg Tabs (Lisinopril) ..... Once daily    Adult Aspirin Ec Low Strength 81 Mg Tbec (Aspirin) ..... Once daily    Metformin Hcl 500 Mg Tabs (Metformin hcl) ..... One by mouth two times a day    Glucotrol 5 Mg Tabs (Glipizide) .Marland Kitchen... Take 1 tablet by mouth two times a day  Orders: Diabetic Clinic Referral (Diabetic)  Complete Medication List: 1)  Lisinopril 40 Mg Tabs (Lisinopril) .... Once daily 2)  Hydrochlorothiazide 25 Mg Tabs (Hydrochlorothiazide) .... Once daily 3)  Simvastatin 20 Mg Tabs (Simvastatin) .... At bedtime 4)  Adult Aspirin Ec Low Strength 81 Mg Tbec (Aspirin) .... Once daily 5)  Nasonex 50 Mcg/act Susp (Mometasone furoate) .Marland KitchenMarland KitchenMarland Kitchen  One spray per nostrile once daily as needed 6)  Metformin Hcl 500 Mg Tabs (Metformin hcl) .... One by mouth two times a day 7)  Accu-chek Aviva Strp (Glucose blood) .... Use once daily for glucose control  dx 250.00 8)  Accu-chek Multiclix Lancets Misc (Lancets) .... Use once daily for glucose control dx 250.00 9)  Glucotrol 5 Mg Tabs (Glipizide) .... Take 1 tablet by mouth two times a day  Patient Instructions: 1)  stop the  hydrochlorothiazide, and decrease the lisinopril to 20 mg daily check her blood pressure daily in the morning. 2)  Increase the metformin to 1000 mg before your breakfast.............. Continue the 500-mg dose prior to evening meal, and the Glucotrol, 5 mg twice daily.  We will put in a request for a dietary consult. 3)  Return in two weeks for follow-up. 4)  Check a fasting blood sugar daily in the morning   Orders Added: 1)  Diabetic Clinic Referral [Diabetic] 2)  Est. Patient Level III [04540]

## 2010-11-22 NOTE — Progress Notes (Signed)
Summary: refill request  Phone Note Refill Request Message from:  Fax from Pharmacy on November 07, 2010 5:04 PM  Refills Requested: Medication #1:  METFORMIN HCL 500 MG TABS one by mouth two times a day  Medication #2:  LISINOPRIL 40 MG  TABS once daily  Medication #3:  GLUCOTROL 5 MG TABS Take 1 tablet by mouth two times a day.  Medication #4:  SIMVASTATIN 20 MG  TABS at bedtime Initial call taken by: Kern Reap CMA Duncan Dull),  November 07, 2010 5:05 PM    Prescriptions: SIMVASTATIN 20 MG  TABS (SIMVASTATIN) at bedtime  #100 x 3   Entered by:   Kern Reap CMA (AAMA)   Authorized by:   Roderick Pee MD   Signed by:   Kern Reap CMA (AAMA) on 11/07/2010   Method used:   Faxed to ...       [facility] (mail-order)             , [place]         Ph: [phone]       Fax: [phone]   RxID:   [id number] GLUCOTROL 5 MG TABS (GLIPIZIDE) Take 1 tablet by mouth two times a day  #200 x 3   Entered by:   Kern Reap CMA (AAMA)   Authorized by:   Roderick Pee MD   Signed by:   Kern Reap CMA (AAMA) on 11/07/2010   Method used:   Faxed to ...       [facility] (mail-order)             , [place]         Ph: [phone]       Fax: [phone]   RxID:   [id number] LISINOPRIL 40 MG  TABS (LISINOPRIL) once daily  #100 x 3   Entered by:   Kern Reap CMA (AAMA)   Authorized by:   Roderick Pee MD   Signed by:   Kern Reap CMA (AAMA) on 11/07/2010   Method used:   Faxed to ...       [facility] (mail-order)             , [place]         Ph: [phone]       Fax: [phone]   RxID:   [id number] METFORMIN HCL 500 MG TABS (METFORMIN HCL) one by mouth two times a day  #180 x 3   Entered by:   Kern Reap CMA (AAMA)   Authorized by:   Roderick Pee MD   Signed by:   Kern Reap CMA (AAMA) on 11/07/2010   Method used:   Faxed to ...       [facility] (mail-order)             , [place]         Ph: [phone]       Fax: [phone]   RxID:   [id number]

## 2010-11-22 NOTE — Assessment & Plan Note (Signed)
Summary: 2 wk rov./njr   Vital Signs:  Patient profile:   45 year old male Weight:      250 pounds Temp:     98.3 degrees F oral BP sitting:   122 / 80  (left arm) Cuff size:   regular  Vitals Entered By: Duard Brady LPN (November 15, 2010 10:06 AM) CC: 2 wk f/u - doing ok     fbs 57 Is Patient Diabetic? Yes Did you bring your meter with you today? No   CC:  2 wk f/u - doing ok     fbs 57.  History of Present Illness: Joshua Castaneda is a 45 year old male, nonsmoker, type II diabetic, who comes in today for follow-up.  He decreased his metformin to 500 b.i.d. from 1000 in the morning and 500 prior to his evening meal.  Because she was experiencing episodes of hypoglycemia in the afternoon.  Blood sugar dropped to 57.  Since that time his morning blood sugars been in the 70 to 60 range.  Asymptomatic.  BP 122/80.    Allergies: 1)  ! * Shell Fish  Past History:  Past medical, surgical, family and social histories (including risk factors) reviewed for relevance to current acute and chronic problems.  Past Medical History: Reviewed history from 02/15/2008 and no changes required. DVT, hx of Hyperlipidemia Hypertension  Family History: Reviewed history from 06/13/2010 and no changes required. father diabetes , type 2, heart murmurmother died 2, cancer, hypertension  two brothers, one has allergies once, obesetwo sisters, one has hypertension.  The other has hypertension also. Father: deceased Mother:  Siblings:   Social History: Reviewed history from 02/15/2008 and no changes required. Occupation:[employer] Single Never Smoked Alcohol use-no Drug use-no Regular exercise-yes  Review of Systems      See HPI  Physical Exam  General:  Well-developed,well-nourished,in no acute distress; alert,appropriate and cooperative throughout examination   Impression & Recommendations:  Problem # 1:  DIABETES-TYPE 2 (ICD-250.00) Assessment Improved  His updated  medication list for this problem includes:    Lisinopril 40 Mg Tabs (Lisinopril) ..... Half tab once daily    Adult Aspirin Ec Low Strength 81 Mg Tbec (Aspirin) ..... Once daily    Metformin Hcl 500 Mg Tabs (Metformin hcl) ..... One by mouth two times a day    Glucotrol 5 Mg Tabs (Glipizide) .Marland Kitchen... Take one  tablet by mouth once daily  Complete Medication List: 1)  Lisinopril 40 Mg Tabs (Lisinopril) .... Half tab once daily 2)  Simvastatin 20 Mg Tabs (Simvastatin) .... At bedtime 3)  Adult Aspirin Ec Low Strength 81 Mg Tbec (Aspirin) .... Once daily 4)  Nasonex 50 Mcg/act Susp (Mometasone furoate) .... One spray per nostrile once daily as needed 5)  Metformin Hcl 500 Mg Tabs (Metformin hcl) .... One by mouth two times a day 6)  Accu-chek Aviva Strp (Glucose blood) .... Use once daily for glucose control  dx 250.00 7)  Accu-chek Multiclix Lancets Misc (Lancets) .... Use once daily for glucose control dx 250.00 8)  Glucotrol 5 Mg Tabs (Glipizide) .... Take one  tablet by mouth once daily  Patient Instructions: 1)  continue the 500 mg of metformin, and the 5 mg of Glucotrol prior to breakfast, but change your evening dose to just a metformin 500 mg prior to evening meal and stopped the evening Glucotrol. 2)  Check a fasting blood sugar daily in the morning. 3)  Return in two weeks for follow   Orders Added: 1)  Est. Patient Level III [id number]

## 2010-11-26 ENCOUNTER — Ambulatory Visit: Payer: Self-pay

## 2010-11-29 ENCOUNTER — Ambulatory Visit (INDEPENDENT_AMBULATORY_CARE_PROVIDER_SITE_OTHER): Payer: 59 | Admitting: Family Medicine

## 2010-11-29 ENCOUNTER — Encounter: Payer: Self-pay | Admitting: Family Medicine

## 2010-11-29 DIAGNOSIS — E119 Type 2 diabetes mellitus without complications: Secondary | ICD-10-CM

## 2010-11-29 DIAGNOSIS — I10 Essential (primary) hypertension: Secondary | ICD-10-CM

## 2010-11-29 MED ORDER — LISINOPRIL 40 MG PO TABS: ORAL_TABLET | ORAL | Status: DC

## 2010-11-29 NOTE — Patient Instructions (Signed)
Continue your good blood sugar control.  Check a blood sugar in the morning Monday, Wednesday, Friday.  Increase the lisinopril to 40 mg daily.  Check y blood pressure daily in the morning.  Return in one month for follow-up

## 2010-11-29 NOTE — Progress Notes (Signed)
  Subjective:    Patient ID: Joshua Castaneda, male    DOB: 1966-06-07, 45 y.o.   MRN: [id number]  HPIeddrin is a 45 year old male, who comes back today for reevaluation of hypertension, and diabetes.  His diabetes is treated with metformin 500 mg b.i.d. And Glucotrol 5 milligrams daily fasting blood sugars in the 70 to 90 range.  No hypoglycemia.  Blood pressure on lisinopril 40 mg daily is 140/88.  This is consistent with what he is getting at home.    Review of Systems    Negative Objective:   Physical Exam Well-developed well-nourished, male in no acute distress       Assessment & Plan:  Hypertension,,,,,,,, increase lisinopril to 40 mg daily BP checked daily.  Return in 4 weeks.  Diabetes under good control.  Check fasting blood sugar Monday, Wednesday, Friday

## 2010-12-10 ENCOUNTER — Encounter: Payer: 59 | Attending: Family Medicine

## 2010-12-10 ENCOUNTER — Encounter: Admit: 2010-12-10 | Payer: Self-pay | Admitting: Family Medicine

## 2010-12-10 ENCOUNTER — Ambulatory Visit: Payer: Self-pay

## 2010-12-10 DIAGNOSIS — Z713 Dietary counseling and surveillance: Secondary | ICD-10-CM | POA: Insufficient documentation

## 2010-12-10 DIAGNOSIS — E119 Type 2 diabetes mellitus without complications: Secondary | ICD-10-CM | POA: Insufficient documentation

## 2010-12-31 ENCOUNTER — Ambulatory Visit (INDEPENDENT_AMBULATORY_CARE_PROVIDER_SITE_OTHER): Payer: 59 | Admitting: Family Medicine

## 2010-12-31 ENCOUNTER — Encounter: Payer: Self-pay | Admitting: Family Medicine

## 2010-12-31 VITALS — BP 112/70 | Temp 99.1°F | Ht 72.0 in | Wt 234.0 lb

## 2010-12-31 DIAGNOSIS — E119 Type 2 diabetes mellitus without complications: Secondary | ICD-10-CM

## 2010-12-31 NOTE — Patient Instructions (Signed)
Continue current medications.  Follow-up in two months labs one week prior

## 2010-12-31 NOTE — Progress Notes (Signed)
  Subjective:    Patient ID: Joshua Castaneda, male    DOB: Apr 02, 1966, 45 y.o.   MRN: [id number]  HPIeddrin Is a delightful, 45 year old male, who comes in today for evaluation of diabetes, type II.  He is currently on glipizide 5 mg daily and med Forman 500 b.i.d. Fasting blood sugars have now dropped to 100.  No hypoglycemia    Review of Systems    Otherwise negative Objective:   Physical Exam Well-developed well-nourished, male in no acute distress       Assessment & Plan:  Diabetes type 2, under excellent control.  Follow-up A1c

## 2011-02-18 ENCOUNTER — Other Ambulatory Visit (INDEPENDENT_AMBULATORY_CARE_PROVIDER_SITE_OTHER): Payer: 59 | Admitting: Family Medicine

## 2011-02-18 DIAGNOSIS — E119 Type 2 diabetes mellitus without complications: Secondary | ICD-10-CM

## 2011-02-18 LAB — BASIC METABOLIC PANEL
BUN: 15 mg/dL (ref 6–23)
CO2: 27 mEq/L (ref 19–32)
Chloride: 102 mEq/L (ref 96–112)
Creatinine, Ser: 1.5 mg/dL (ref 0.4–1.5)
Glucose, Bld: 72 mg/dL (ref 70–99)
Potassium: 4.2 mEq/L (ref 3.5–5.1)

## 2011-02-25 ENCOUNTER — Encounter (INDEPENDENT_AMBULATORY_CARE_PROVIDER_SITE_OTHER): Payer: 59

## 2011-02-25 DIAGNOSIS — I83893 Varicose veins of bilateral lower extremities with other complications: Secondary | ICD-10-CM

## 2011-02-28 ENCOUNTER — Encounter (INDEPENDENT_AMBULATORY_CARE_PROVIDER_SITE_OTHER): Payer: Self-pay | Admitting: Vascular Surgery

## 2011-02-28 DIAGNOSIS — I83893 Varicose veins of bilateral lower extremities with other complications: Secondary | ICD-10-CM

## 2011-03-04 ENCOUNTER — Encounter: Payer: Self-pay | Admitting: Family Medicine

## 2011-03-04 ENCOUNTER — Ambulatory Visit (INDEPENDENT_AMBULATORY_CARE_PROVIDER_SITE_OTHER): Payer: 59 | Admitting: Family Medicine

## 2011-03-04 VITALS — BP 130/90 | Temp 98.0°F | Wt 234.0 lb

## 2011-03-04 DIAGNOSIS — E119 Type 2 diabetes mellitus without complications: Secondary | ICD-10-CM

## 2011-03-04 NOTE — Patient Instructions (Signed)
Continue your current medications, diet, and exercise.  Follow-up in the fall for her annual exam.  Labs one week prior

## 2011-03-04 NOTE — Progress Notes (Signed)
  Subjective:    Patient ID: Joshua Castaneda, male    DOB: 1966/10/08, 45 y.o.   MRN: [id number]  HPI Joshua Castaneda  Is a 45 year old male, who comes in today for evaluation of type 1 diabetes.  He was doing well until about 4 months ago when his blood sugar went up.  A1c went to 11.2%.  The gun a strict diet and exercise program and now his blood sugar is in the 80 to 90 range.  A1c is dropped to 5.7%.  He's actually decreased his metformin from 500 b.i.d. To 500 daily because his blood sugars were dropping too low.  He also takes glipizide 5 mg daily.   Review of Systems General and metabolic review of systems negative    Objective:   Physical Exam    Well-developed well-nourished, male in no acute distress    Assessment & Plan:  Diabetes type 2, under excellent control,,,,,,,,, CPX next fall

## 2011-03-05 NOTE — Procedures (Signed)
DUPLEX DEEP VENOUS EXAM - LOWER EXTREMITY   INDICATION:  Bilateral lower extremity edema, left worse than right.  Known left leg DVT.   HISTORY:  Edema:  2-3 months of lower extremities, left worse than  right.  Trauma/Surgery:  No  Pain:  Patient complains of dull ache in left leg.  PE:  No  Previous DVT:  Yes, patient was diagnosed with DVT in 1996 by venogram  of the left leg.  Anticoagulants:  No  Other:  Hypertension.   DUPLEX EXAM:                CFV   SFV   PopV   PTV   GSV                R  L  R  L  R  L   R  L  R  L  Thrombosis    O  O  O  P  O  +   O  P  +  +  Spontaneous   +  +  +  +  +  P   +  +  +  +  Phasic        +  +  +  +  +  P   +  +  +  +  Augmentation  +  +  +  +  +  P   +  +  +  +  Compressible  +  +  +  +  +  P   +  +  +  +  Competent     +  +  +  +  +  +   +  +  +  +   Legend:  + - yes  o - no  p - partial  D - decreased    IMPRESSION:  1. No evidence of deep or superficial vein thrombus or Baker Cyst in      the right leg.  2. 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  Veins in the left leg appear to be      competent including the superficial veins.        _____________________________  Quita Skye Hart [place], M.D.   MC/MEDQ  D:  02/20/2009  T:  02/20/2009  Job:  [id number]

## 2011-03-05 NOTE — Consult Note (Signed)
VASCULAR SURGERY CONSULTATION   SCHARNHORST, Guage  DOB:  September 05, 1966                                       02/20/2009  [id number]#:[id number]   The patient is a healthy 45 year old male referred for swelling in the  left lower extremity which is worsened over the last several months.  This patient had a deep venous thrombosis diagnosed in 1996 in the left  leg.  He did not receive treat with anticoagulants and apparently the  diagnosis was made several weeks following the onset of his swelling.  Since that time he has had chronic swelling in the left leg,  particularly when he spends a lot of time on his feet, but he thinks  that this has worsened over the last several months.  He has no history  of stasis ulcers, varicose veins, bleeding or other problems and has had  no venous thrombosis in his contralateral right leg or his upper  extremities.  He currently takes 1 aspirin per day.   PAST MEDICAL HISTORY:  Negative for diabetes, hypertension, coronary  artery disease, hyperlipidemia, COPD or stroke.   PREVIOUS SURGERY:  None.   FAMILY HISTORY:  Positive for cerebral aneurysm in his mother,  peripheral vascular disease in his father.  Negative for coronary artery  disease, diabetes and stroke.   SOCIAL HISTORY:  He is married, has 1 child, works in [employer].  He does not use tobacco or alcohol.   REVIEW OF SYSTEMS:  Essentially unremarkable with no chest pain, dyspnea  on exertion, PND, orthopnea, GI or GU symptoms.  He is able to ambulate  long distances without difficulty.   ALLERGIES:  To typhoid immunization.   PHYSICAL EXAMINATION:  Blood pressure 150/96, heart rate is 80,  respirations 14.  General:  He is a healthy-appearing middle-aged male  in no apparent distress, alert and oriented x3.  Neck:  Supple, 3+  carotid pulses palpable.  No bruits are audible.  Neurologic:  Normal.  No palpable adenopathy in the neck.  Chest:  Clear  to auscultation.  Cardiovascular:  Regular rhythm.  No murmurs.  Abdomen:  Soft,  nontender, with no masses.  He has 3+ femoral, popliteal, dorsalis pedis  and posterior tibial pulses bilaterally.  Right leg has no edema.  Left  leg has 1-2+ edema from the knee to the ankle.  He does have some  hyperpigmentation over a patch of about 8 cm laterally in the left leg  midway between the knee and the ankle.  There are no active ulcers or  varicosities noted.   Venous duplex exam reveals the right leg to be essentially normal.  Left  leg has some chronic thrombus from the distal superficial femoral vein  into the tibioperoneal trunk with a patent flow channel but no other  abnormalities.   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  I have encouraged him to:  1. Elevate the legs at night effectively.  2. Apply a short-leg elastic compression stocking first thing in the      morning before ambulating.  3. Try to elevate the leg some during the day if possible.   Hopefully, these conservative measures will assist in managing his  swelling and prevent other sequelae  of venous insufficiency.   Quita Skye Hart [place], M.D.  Electronically Signed  JDL/MEDQ  D:  02/20/2009  T:  02/21/2009  Job:  [id number]   cc:   Tinnie Gens A. Tawanna Cooler, MD

## 2011-04-15 ENCOUNTER — Ambulatory Visit (INDEPENDENT_AMBULATORY_CARE_PROVIDER_SITE_OTHER): Payer: 59 | Admitting: Family Medicine

## 2011-04-15 ENCOUNTER — Encounter: Payer: Self-pay | Admitting: Family Medicine

## 2011-04-15 DIAGNOSIS — M7042 Prepatellar bursitis, left knee: Secondary | ICD-10-CM

## 2011-04-15 DIAGNOSIS — M7040 Prepatellar bursitis, unspecified knee: Secondary | ICD-10-CM

## 2011-04-15 NOTE — Patient Instructions (Signed)
Take it 600 mg of Motrin twice daily with food.  Elevation and ice as much as possible.  No running or jumping.  Okay to work.  See Dr. Alvester Morin orthopedist if symptoms persist

## 2011-04-15 NOTE — Progress Notes (Signed)
  Subjective:    Patient ID: Joshua Castaneda, male    DOB: 03-Jun-1966, 45 y.o.   MRN: [id number]  HPI  Wash Is a 45 year old unmarried male, nonsmoker, who comes in today for evaluation of pain in his left knee for about 4 weeks.  He works for [employer] and does a lot of bending, stooping, and has to spend time on his knees.  About 4 weeks ago he noticed some swelling and soreness in that left knee.  No history of previous trauma.  No locking.  Orthopedic review of systems otherwise negative    Review of Systems    General an orthopedic review of systems otherwise negative Objective:   Physical Exam    Well-developed well-nourished, male in no acute distress.  Examination of right knee is normal.  Visual inspection of the left knee shows it to be slightly swollen.  On palpation.  The left knee is warm.  Ligaments cartilage is intact    Assessment & Plan:  Bursitis left knee.  Plan Motrin, 600 b.i.d., elevation and ice.  Return p.r.n.

## 2011-08-29 ENCOUNTER — Other Ambulatory Visit: Payer: 59

## 2011-09-05 ENCOUNTER — Encounter: Payer: 59 | Admitting: Family Medicine

## 2011-09-09 ENCOUNTER — Other Ambulatory Visit: Payer: Self-pay | Admitting: *Deleted

## 2011-09-09 ENCOUNTER — Ambulatory Visit (INDEPENDENT_AMBULATORY_CARE_PROVIDER_SITE_OTHER): Payer: 59 | Admitting: Family Medicine

## 2011-09-09 ENCOUNTER — Encounter: Payer: Self-pay | Admitting: Family Medicine

## 2011-09-09 DIAGNOSIS — E119 Type 2 diabetes mellitus without complications: Secondary | ICD-10-CM

## 2011-09-09 DIAGNOSIS — I10 Essential (primary) hypertension: Secondary | ICD-10-CM

## 2011-09-09 DIAGNOSIS — Z0000 Encounter for general adult medical examination without abnormal findings: Secondary | ICD-10-CM

## 2011-09-09 DIAGNOSIS — E785 Hyperlipidemia, unspecified: Secondary | ICD-10-CM

## 2011-09-09 LAB — BASIC METABOLIC PANEL
BUN: 15 mg/dL (ref 6–23)
Chloride: 103 mEq/L (ref 96–112)
Creatinine, Ser: 1.2 mg/dL (ref 0.4–1.5)
Glucose, Bld: 77 mg/dL (ref 70–99)
Potassium: 4.1 mEq/L (ref 3.5–5.1)

## 2011-09-09 LAB — CBC WITH DIFFERENTIAL/PLATELET
Basophils Relative: 1.1 % (ref 0.0–3.0)
Eosinophils Absolute: 0.3 10*3/uL (ref 0.0–0.7)
Eosinophils Relative: 6.3 % — ABNORMAL HIGH (ref 0.0–5.0)
Hemoglobin: 13 g/dL (ref 13.0–17.0)
Lymphocytes Relative: 45.4 % (ref 12.0–46.0)
MCHC: 33.4 g/dL (ref 30.0–36.0)
MCV: 85.3 fl (ref 78.0–100.0)
Monocytes Absolute: 0.5 10*3/uL (ref 0.1–1.0)
Neutro Abs: 2 10*3/uL (ref 1.4–7.7)
RBC: 4.55 Mil/uL (ref 4.22–5.81)
WBC: 5.2 10*3/uL (ref 4.5–10.5)

## 2011-09-09 LAB — MICROALBUMIN / CREATININE URINE RATIO: Microalb, Ur: 0.7 mg/dL (ref 0.0–1.9)

## 2011-09-09 LAB — POCT URINALYSIS DIPSTICK
Bilirubin, UA: NEGATIVE
Glucose, UA: NEGATIVE
Ketones, UA: NEGATIVE
Leukocytes, UA: NEGATIVE
Nitrite, UA: NEGATIVE
pH, UA: 5.5

## 2011-09-09 LAB — HEPATIC FUNCTION PANEL
Alkaline Phosphatase: 62 U/L (ref 39–117)
Bilirubin, Direct: 0.2 mg/dL (ref 0.0–0.3)

## 2011-09-09 LAB — LIPID PANEL
LDL Cholesterol: 68 mg/dL (ref 0–99)
Total CHOL/HDL Ratio: 2
VLDL: 14.6 mg/dL (ref 0.0–40.0)

## 2011-09-09 LAB — TSH: TSH: 1.22 u[IU]/mL (ref 0.35–5.50)

## 2011-09-09 LAB — HEMOGLOBIN A1C: Hgb A1c MFr Bld: 6 % (ref 4.6–6.5)

## 2011-09-09 MED ORDER — METFORMIN HCL 500 MG PO TABS
500.0000 mg | ORAL_TABLET | Freq: Two times a day (BID) | ORAL | Status: DC
Start: 2011-09-09 — End: 2012-11-17

## 2011-09-09 MED ORDER — GLUCOSE BLOOD VI STRP: ORAL_STRIP | Status: DC

## 2011-09-09 MED ORDER — LISINOPRIL 40 MG PO TABS: ORAL_TABLET | ORAL | Status: DC

## 2011-09-09 MED ORDER — SIMVASTATIN 20 MG PO TABS: 20.0000 mg | ORAL_TABLET | Freq: Every day | ORAL | Status: DC

## 2011-09-09 NOTE — Progress Notes (Signed)
  Subjective:    Patient ID: Joshua Castaneda, male    DOB: 1966-05-21, 45 y.o.   MRN: [id number]  HPI Joshua Castaneda Is a 45 year old, married male, nonsmoker, who comes in today for annual examination.  Because of a history of diabetes, hypertension, and hyperlipidemia.  He states on metformin 250 mg every other day.  His blood sugars dropped to 84.  He stopped the glipizide.  He takes Zocor 20 mg nightly for hyperlipidemia, and an aspirin tablet.  He takes lisinopril 40 mg daily for hypertension.  BP at home 134/80.     Review of Systems  Constitutional: Negative.   HENT: Negative.   Eyes: Negative.   Respiratory: Negative.   Cardiovascular: Negative.   Gastrointestinal: Negative.   Genitourinary: Negative.   Musculoskeletal: Negative.   Skin: Negative.   Neurological: Negative.   Hematological: Negative.   Psychiatric/Behavioral: Negative.        Objective:   Physical Exam  Constitutional: He is oriented to person, place, and time. He appears well-developed and well-nourished.  HENT:  Head: Normocephalic and atraumatic.  Right Ear: External ear normal.  Left Ear: External ear normal.  Nose: Nose normal.  Mouth/Throat: Oropharynx is clear and moist.  Eyes: Conjunctivae and EOM are normal. Pupils are equal, round, and reactive to light.  Neck: Normal range of motion. Neck supple. No JVD present. No tracheal deviation present. No thyromegaly present.  Cardiovascular: Normal rate, regular rhythm, normal heart sounds and intact distal pulses.  Exam reveals no gallop and no friction rub.   No murmur heard. Pulmonary/Chest: Effort normal and breath sounds normal. No stridor. No respiratory distress. He has no wheezes. He has no rales. He exhibits no tenderness.  Abdominal: Soft. Bowel sounds are normal. He exhibits no distension and no mass. There is no tenderness. There is no rebound and no guarding.  Genitourinary: Rectum normal, prostate normal and penis normal. Guaiac negative  stool. No penile tenderness.  Musculoskeletal: Normal range of motion. He exhibits no edema and no tenderness.  Lymphadenopathy:    He has no cervical adenopathy.  Neurological: He is alert and oriented to person, place, and time. He has normal reflexes. No cranial nerve deficit. He exhibits normal muscle tone.  Skin: Skin is warm and dry. No rash noted. No erythema. No pallor.  Psychiatric: He has a normal mood and affect. His behavior is normal. Judgment and thought content normal.          Assessment & Plan:  Healthy male.  Diabetes type II check labs.  Hyperlipidemia.  Continue simvastatin 20 mg daily and an aspirin.  Hypertension.  Continue lisinopril 40 mg daily.  Recommended annual eye exam he has not been in a year or so.  Dr. Vonna Kotyk

## 2011-09-09 NOTE — Patient Instructions (Signed)
Continue your current medications for now.  I will call you in a couple days to discuss it.  Diabetic medications and follow-up

## 2011-09-26 ENCOUNTER — Ambulatory Visit (INDEPENDENT_AMBULATORY_CARE_PROVIDER_SITE_OTHER): Payer: 59 | Admitting: Family Medicine

## 2011-09-26 ENCOUNTER — Encounter: Payer: Self-pay | Admitting: Family Medicine

## 2011-09-26 DIAGNOSIS — H9312 Tinnitus, left ear: Secondary | ICD-10-CM

## 2011-09-26 DIAGNOSIS — H9319 Tinnitus, unspecified ear: Secondary | ICD-10-CM

## 2011-09-26 NOTE — Progress Notes (Signed)
  Subjective:    Patient ID: Joshua Castaneda, male    DOB: [birth year], 45 y.o.   MRN: [id number]  HPI E.  Is a 45 year old male, nonsmoker, who comes in today for evaluation of ringing in his left ear is  He noticed this for about a week.  No fever no earache.  No dizziness.  Today it's gone   Review of Systems    General an ENT review of systems otherwise negative Objective:   Physical Exam  Well-developed well-nourished man no acute distress.  Ears were both normal      Assessment & Plan:  Tinnitus ..........Marland Kitchenreassured

## 2011-09-26 NOTE — Patient Instructions (Signed)
Called Dr. Mia Creek for an eye exam.  Return p.r.n.

## 2012-02-11 ENCOUNTER — Ambulatory Visit: Payer: 59 | Admitting: Family

## 2012-02-11 ENCOUNTER — Encounter: Payer: Self-pay | Admitting: Family Medicine

## 2012-02-11 ENCOUNTER — Ambulatory Visit (INDEPENDENT_AMBULATORY_CARE_PROVIDER_SITE_OTHER): Payer: 59 | Admitting: Family Medicine

## 2012-02-11 VITALS — BP 120/72 | HR 71 | Temp 98.3°F | Wt 230.0 lb

## 2012-02-11 DIAGNOSIS — J40 Bronchitis, not specified as acute or chronic: Secondary | ICD-10-CM

## 2012-02-11 MED ORDER — AZITHROMYCIN 250 MG PO TABS: ORAL_TABLET | ORAL | Status: AC

## 2012-02-11 NOTE — Progress Notes (Signed)
  Subjective:    Patient ID: Joshua Castaneda, male    DOB: 06-20-1966, 46 y.o.   MRN: [id number]  HPI Here for one week of stuffy head, PND, ST, and coughing up green sputum. No fever.    Review of Systems  Constitutional: Negative.   HENT: Positive for congestion, postnasal drip and sinus pressure.   Eyes: Negative.   Respiratory: Positive for cough.        Objective:   Physical Exam  Constitutional: He appears well-developed and well-nourished.  HENT:  Right Ear: External ear normal.  Left Ear: External ear normal.  Nose: Nose normal.  Mouth/Throat: Oropharynx is clear and moist. No oropharyngeal exudate.  Eyes: Conjunctivae are normal.  Pulmonary/Chest: Effort normal and breath sounds normal. No respiratory distress. He has no wheezes. He has no rales.       Scattered rhonchi   Lymphadenopathy:    He has no cervical adenopathy.          Assessment & Plan:  Add Mucinex prn

## 2012-11-17 ENCOUNTER — Inpatient Hospital Stay (HOSPITAL_COMMUNITY)
Admission: EM | Admit: 2012-11-17 | Discharge: 2012-11-19 | DRG: 541 | Disposition: A | Discharge status: Home / Self Care | Payer: BC Managed Care – PPO | Attending: Internal Medicine | Admitting: Internal Medicine

## 2012-11-17 ENCOUNTER — Ambulatory Visit (INDEPENDENT_AMBULATORY_CARE_PROVIDER_SITE_OTHER): Payer: BC Managed Care – PPO | Admitting: Family Medicine

## 2012-11-17 ENCOUNTER — Encounter: Payer: Self-pay | Admitting: Family Medicine

## 2012-11-17 ENCOUNTER — Encounter (HOSPITAL_COMMUNITY): Payer: Self-pay

## 2012-11-17 ENCOUNTER — Emergency Department (HOSPITAL_COMMUNITY): Payer: BC Managed Care – PPO

## 2012-11-17 VITALS — BP 130/90 | HR 87 | Temp 97.8°F | Wt 230.0 lb

## 2012-11-17 DIAGNOSIS — E119 Type 2 diabetes mellitus without complications: Secondary | ICD-10-CM

## 2012-11-17 DIAGNOSIS — N281 Cyst of kidney, acquired: Secondary | ICD-10-CM

## 2012-11-17 DIAGNOSIS — N182 Chronic kidney disease, stage 2 (mild): Secondary | ICD-10-CM | POA: Diagnosis present

## 2012-11-17 DIAGNOSIS — I129 Hypertensive chronic kidney disease with stage 1 through stage 4 chronic kidney disease, or unspecified chronic kidney disease: Secondary | ICD-10-CM | POA: Diagnosis present

## 2012-11-17 DIAGNOSIS — I10 Essential (primary) hypertension: Secondary | ICD-10-CM

## 2012-11-17 DIAGNOSIS — R0602 Shortness of breath: Secondary | ICD-10-CM | POA: Insufficient documentation

## 2012-11-17 DIAGNOSIS — I2699 Other pulmonary embolism without acute cor pulmonale: Principal | ICD-10-CM

## 2012-11-17 DIAGNOSIS — E785 Hyperlipidemia, unspecified: Secondary | ICD-10-CM

## 2012-11-17 DIAGNOSIS — R9431 Abnormal electrocardiogram [ECG] [EKG]: Secondary | ICD-10-CM

## 2012-11-17 DIAGNOSIS — I824Y9 Acute embolism and thrombosis of unspecified deep veins of unspecified proximal lower extremity: Secondary | ICD-10-CM | POA: Diagnosis present

## 2012-11-17 DIAGNOSIS — H9312 Tinnitus, left ear: Secondary | ICD-10-CM

## 2012-11-17 DIAGNOSIS — Z7901 Long term (current) use of anticoagulants: Secondary | ICD-10-CM

## 2012-11-17 DIAGNOSIS — N179 Acute kidney failure, unspecified: Secondary | ICD-10-CM | POA: Diagnosis present

## 2012-11-17 DIAGNOSIS — Z86718 Personal history of other venous thrombosis and embolism: Secondary | ICD-10-CM

## 2012-11-17 DIAGNOSIS — N289 Disorder of kidney and ureter, unspecified: Secondary | ICD-10-CM | POA: Diagnosis present

## 2012-11-17 HISTORY — DX: Metabolic syndrome: E88.810

## 2012-11-17 HISTORY — DX: Acute embolism and thrombosis of unspecified deep veins of unspecified lower extremity: I82.409

## 2012-11-17 HISTORY — DX: Metabolic syndrome: E88.81

## 2012-11-17 LAB — HOMOCYSTEINE: Homocysteine: 10.1 umol/L (ref 4.0–15.4)

## 2012-11-17 LAB — ANTITHROMBIN III: AntiThromb III Func: 107 % (ref 75–120)

## 2012-11-17 LAB — CBC: HCT: 38.4 % — ABNORMAL LOW (ref 39.0–52.0)

## 2012-11-17 LAB — BASIC METABOLIC PANEL
BUN: 18 mg/dL (ref 6–23)
CO2: 28 mEq/L (ref 19–32)
Chloride: 103 mEq/L (ref 96–112)
Glucose, Bld: 89 mg/dL (ref 70–99)
Potassium: 3.9 mEq/L (ref 3.5–5.1)

## 2012-11-17 LAB — HEPARIN LEVEL (UNFRACTIONATED): Heparin Unfractionated: 0.61 IU/mL (ref 0.30–0.70)

## 2012-11-17 MED ORDER — CARVEDILOL 3.125 MG PO TABS
3.1250 mg | ORAL_TABLET | Freq: Two times a day (BID) | ORAL | Status: DC
  Administered 2012-11-18: 3.125 mg via ORAL
  Filled 2012-11-17 (×4): qty 1

## 2012-11-17 MED ORDER — RIVAROXABAN 15 MG PO TABS
15.0000 mg | ORAL_TABLET | Freq: Two times a day (BID) | ORAL | Status: DC
  Filled 2012-11-17 (×2): qty 1

## 2012-11-17 MED ORDER — RIVAROXABAN 15 MG PO TABS
15.0000 mg | ORAL_TABLET | Freq: Two times a day (BID) | ORAL | Status: DC
Start: 2012-11-18 — End: 2012-11-19
  Administered 2012-11-18 – 2012-11-19 (×2): 15 mg via ORAL
  Filled 2012-11-17 (×4): qty 1

## 2012-11-17 MED ORDER — IOHEXOL 350 MG/ML SOLN
100.0000 mL | Freq: Once | INTRAVENOUS | Status: AC | PRN
  Administered 2012-11-17: 100 mL via INTRAVENOUS

## 2012-11-17 MED ORDER — HEPARIN SODIUM (PORCINE) 5000 UNIT/ML IJ SOLN
5000.0000 [IU] | Freq: Once | INTRAMUSCULAR | Status: AC
  Administered 2012-11-17: 5000 [IU] via INTRAVENOUS

## 2012-11-17 MED ORDER — RIVAROXABAN 15 MG PO TABS: 15.0000 mg | ORAL_TABLET | Freq: Every day | ORAL | Status: DC

## 2012-11-17 MED ORDER — ASPIRIN 81 MG PO CHEW
324.0000 mg | CHEWABLE_TABLET | Freq: Once | ORAL | Status: AC
  Administered 2012-11-17: 324 mg via ORAL
  Filled 2012-11-17: qty 4

## 2012-11-17 MED ORDER — HEPARIN (PORCINE) IN NACL 100-0.45 UNIT/ML-% IJ SOLN
1350.0000 [IU]/h | INTRAMUSCULAR | Status: AC
  Administered 2012-11-17: 1350 [IU]/h via INTRAVENOUS
  Filled 2012-11-17 (×2): qty 250

## 2012-11-17 MED ORDER — ALBUTEROL SULFATE HFA 108 (90 BASE) MCG/ACT IN AERS: INHALATION_SPRAY | RESPIRATORY_TRACT | Status: DC

## 2012-11-17 MED ORDER — HEPARIN (PORCINE) IN NACL 100-0.45 UNIT/ML-% IJ SOLN
1350.0000 [IU]/h | Freq: Once | INTRAMUSCULAR | Status: AC
  Administered 2012-11-17: 1350 [IU]/h via INTRAVENOUS
  Filled 2012-11-17 (×2): qty 250

## 2012-11-17 NOTE — ED Notes (Signed)
Pt was seen at [facility] this AM for scheduled appt. Per pt has been feeling increased SOB with walking up stairs and exercising. Denies Pain. New twave inversion on seen on EKG. 20g LAC.

## 2012-11-17 NOTE — ED Notes (Signed)
Called pharmacy. Still have not received heparin. They are aware.

## 2012-11-17 NOTE — ED Provider Notes (Signed)
History     CSN: [id number]  Arrival date & time 11/17/12  1056   First MD Initiated Contact with Patient 11/17/12 1057      Chief Complaint  Patient presents with  . Abnormal ECG    (Consider location/radiation/quality/duration/timing/severity/associated sxs/prior treatment) HPI Comments: 47 y/o F p/w SOB with exertion and EKG changes. Patient with worsening SOB with exertion for past couple weeks. None at rest. No chest pain. Presented to PCP today. Found to have twave inversions V1-V5. Patient denies any other symptoms.  Patient is a 47 y.o. male presenting with shortness of breath. The history is provided by the patient and the spouse.  Shortness of Breath  The current episode started more than 2 weeks ago. The onset was gradual. The problem occurs continuously. The problem has been gradually worsening. The problem is moderate. The symptoms are relieved by rest. The symptoms are aggravated by activity. Associated symptoms include shortness of breath. Pertinent negatives include no chest pain, no chest pressure, no orthopnea, no fever, no rhinorrhea and no cough. Past medical history comments: DVT many years ago. He has received no recent medical care.    Past Medical History  Diagnosis Date  . DVT (deep venous thrombosis)     had LLE dvt in 1997  . Metabolic syndrome     History reviewed. No pertinent past surgical history.  Family History  Problem Relation Age of Onset  . Heart failure Father   . Stroke Mother   . Hypertension Sister   . Hypertension Brother     History  Substance Use Topics  . Smoking status: Never Smoker   . Smokeless tobacco: Never Used  . Alcohol Use: No      Review of Systems  Constitutional: Negative for fever and chills.  HENT: Negative for congestion and rhinorrhea.   Respiratory: Positive for shortness of breath. Negative for cough.   Cardiovascular: Positive for leg swelling (chronic to LLE. no worse than baseline.). Negative for  chest pain and orthopnea.  Gastrointestinal: Negative for nausea, vomiting, abdominal pain and diarrhea.  Genitourinary: Negative for flank pain and difficulty urinating.  Musculoskeletal: Negative for back pain.  Skin: Negative for color change and rash.  Neurological: Negative for dizziness and headaches.  All other systems reviewed and are negative.    Allergies  Shellfish allergy and Shrimp flavor  Home Medications   Current Outpatient Rx  Name  Route  Sig  Dispense  Refill  . ASPIRIN 81 MG PO TABS   Oral   Take 81 mg by mouth daily.             BP 156/101  Pulse 69  Temp 98.4 F (36.9 C)  Resp 16  SpO2 96%  Physical Exam  Nursing note and vitals reviewed. Constitutional: He is oriented to person, place, and time. He appears well-developed and well-nourished. No distress.  HENT:  Head: Normocephalic and atraumatic.  Eyes: Conjunctivae normal are normal. Right eye exhibits no discharge. Left eye exhibits no discharge.  Neck: No tracheal deviation present.  Cardiovascular: Normal rate, regular rhythm, normal heart sounds and intact distal pulses.   Pulmonary/Chest: Effort normal and breath sounds normal. No stridor. No respiratory distress. He has no wheezes. He has no rales.  Abdominal: Soft. He exhibits no distension. There is no tenderness. There is no guarding.  Musculoskeletal: He exhibits no edema and no tenderness.  Neurological: He is alert and oriented to person, place, and time.  Skin: Skin is warm and dry.  Psychiatric: He has a normal mood and affect. His behavior is normal.    ED Course  Procedures (including critical care time)  Labs Reviewed  CBC - Abnormal; Notable for the following:    Hemoglobin 12.9 (*)     HCT 38.4 (*)     All other components within normal limits  BASIC METABOLIC PANEL - Abnormal; Notable for the following:    Creatinine, Ser 1.50 (*)     GFR calc non Af Amer 55 (*)     GFR calc Af Amer 63 (*)     All other components  within normal limits  TROPONIN I  ANTITHROMBIN III  PROTEIN C ACTIVITY  PROTEIN C, TOTAL  PROTEIN S ACTIVITY  PROTEIN S, TOTAL  LUPUS ANTICOAGULANT PANEL  BETA-2-GLYCOPROTEIN I ABS, IGG/M/A  HOMOCYSTEINE  FACTOR 5 LEIDEN  CARDIOLIPIN ANTIBODIES, IGG, IGM, IGA  HEPARIN LEVEL (UNFRACTIONATED)   Dg Chest 2 View  11/17/2012  *RADIOLOGY REPORT*  Clinical Data: Abnormal electrocardiogram  CHEST - 2 VIEW  Comparison: None.  Findings: Lungs clear.  Heart size and pulmonary vascularity are normal.  No adenopathy.  No pneumothorax.  No bone lesions.  IMPRESSION: No abnormality noted.   Original Report Authenticated By: Bretta Bang, M.D.    Ct Angio Chest W/cm &/or Wo Cm  11/17/2012  *RADIOLOGY REPORT*  Clinical Data: Rule out pulmonary embolism, history of prior left leg DVT and 2006  CT ANGIOGRAPHY CHEST  Technique:  Multidetector CT imaging of the chest using the standard protocol during bolus administration of intravenous contrast. Multiplanar reconstructed images including MIPs were obtained and reviewed to evaluate the vascular anatomy.  Contrast: OMNIPAQUE IOHEXOL 350 MG/ML SOLN  Comparison: Prior CT abdomen/pelvis 01/28/2007.  Findings:  Mediastinum: Unremarkable CT appearance of the thyroid gland.  No suspicious mediastinal or hilar adenopathy.  Small hiatal hernia.  Heart/Vascular: Excellent opacification of the pulmonary arteries to the subsegmental level.  There are central filling defects within the bilateral main pulmonary arteries extending into lobar, segmental and subsegmental pulmonary arteries supplying the right upper, middle and lower lobe as well as in the left upper and lower lobes.  Overall embolic burden greater on the right than the left. There may be mild bowing of the interatrial septum with a reflux of contrast material into the suprahepatic IVC.  No pericardial effusion. Mild cardiomegaly with right atrial enlargement.  Lungs/Pleura: The lungs are clear.  No focal  consolidation, pleural effusion or suspicious pulmonary nodule.  There is an isolated blood versus pulmonary cysts in the medial left lower lobe.  Upper Abdomen: A low attenuation cystic lesion in the upper pole of the right kidney measures up to 3.2 cm in diameter which is enlarged compared to 2.3 cm in diameter on the prior study from 2008.  The lesion is incompletely characterized on the present study  Bones: No acute fracture or aggressive appearing lytic or blastic osseous lesion.  IMPRESSION:  1.  CT findings are consistent with sub massive pulmonary embolism. Large volume bilateral pulmonary emboli involving all lobes of both lungs.  Additionally, there is evidence of right heart strain with right atrial dilatation, bowing of the interatrial septum and reflux of contrast into the suprahepatic IVC.  2.  Enlarging cystic lesion in the upper pole right kidney compared to 2008 is incompletely characterized on the present study.  While this most likely represents a simple cyst, this could be confirmed with a non emergent renal ultrasound when the patient is clinically stable.  Critical Value/emergent  results were called by telephone at the time of interpretation on 11/16/2012 at 01:45 p.m. to Dr. Stevie Kern, who verbally acknowledged these results.   Original Report Authenticated By: Malachy Moan, M.D.      1. SOB (shortness of breath)   2. EKG abnormalities   3. Pulmonary embolism, bilateral      Date: 11/17/2012  Rate: 76  Rhythm: normal sinus rhythm  QRS Axis: normal  Intervals: QT prolonged  ST/T Wave abnormalities: T wave inversion V1-V5, III and aVF  Conduction Disutrbances:none  Narrative Interpretation:   Old EKG Reviewed: New T wave inversions    MDM    47 y/o male h/o DVT p/w sob with exertion and EKG changes. Concern for possible PE. CTA chest to further assess. Positive for sub-massive PE involving all lobes.  Discussed with ICU. As patient is HDS and asymptomatic can  admit to hospitalist. Patient admitted to hospitalist. Started on heparin ggt.  Labs and imaging reviewed by myself and considered in medical decision making if ordered. Imaging interpreted by radiology.   Discussed case with Dr. Anitra Lauth who is in agreement with assessment and plan.         Stevie Kern, MD 11/17/12 [phone]

## 2012-11-17 NOTE — Progress Notes (Signed)
[facility] History and Physical  Quince Santana ZOX:[id number] DOB: [birth year] DOA: 11/17/2012  Referring physician: EDP PCP: Evette Georges, MD  Specialists: None  Chief Complaint: CP + SOB  HPI: Joshua Castaneda is a 47 y.o. male who started to develop increasing WOB with minimal exertion such as walking upstairs.  Usually very active exercises at home.  He went to get checked out at Dr. Vinson Moselle noticed this only for the paast 2 weeks.  Not on Testosterone supplements.  Non-smoker.  No h/o cancer No cough,fever chills NO CP  Went to PCP's office and because of ST T wave inversions on EKG without any known prior issue and normal EKG in 2012 was referred emergently to emergency room for further workup  CT scan chest showed submassive pulmonary embolism, large volume bilateral prominent ligating although with both lungs + right heart strain greater than patient going of the interatrial septum and reflux of contrast. Enlarging cystic lesion of the upper pole right kidney notable. 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 No pertinent past medical history. History reviewed. No pertinent past surgical history. Social History:  reports that he has never smoked. He has never used smokeless tobacco. He reports that he does not drink alcohol or use illicit drugs.  Allergies  Allergen Reactions  . Shellfish Allergy     REACTION: throat swells  . Shrimp Flavor     REACTION: throat swells    Family History  Problem Relation Age of Onset  . Heart failure Father   . Stroke Mother   . Hypertension Sister   . Hypertension Brother      Prior to Admission medications   Medication Sig Start Date End Date Taking? Authorizing Provider  aspirin 81 MG tablet Take 81 mg by mouth daily.     Yes Historical Provider, MD     Physical Exam: Filed Vitals:   11/17/12 1200 11/17/12 1330 11/17/12 1335 11/17/12 1430  BP: 150/107 161/95 161/95 156/101  Pulse: 72 70 73 69  Temp:      Resp: 20 17 17 16   SpO2: 98% 96% 99% 96%     General:  Alert pleasant NAD  Eyes: eomi, no pallor/ict  ENT: nad  Neck: soft supple/NT  Cardiovascular: s1 s2 no m/r/g-slightly tachycardic  Respiratory: clear, no tvr/f  Abdomen: soft/nt/nd  Skin: soft, no edema  Musculoskeletal: rom intact   Psychiatric: euthymic  Neurologic: grossly intact  Labs on Admission:  Basic Metabolic Panel:  Lab 11/17/12 8119  NA 140  K 3.9  CL 103  CO2 28  GLUCOSE 89  BUN 18  CREATININE 1.50*  CALCIUM 9.5  MG --  PHOS --   Liver Function Tests: No results found for this basename: AST:5,ALT:5,ALKPHOS:5,BILITOT:5,PROT:5,ALBUMIN:5 in the last 168 hours No results found for this basename: LIPASE:5,AMYLASE:5 in the last 168 hours No results found for this basename: AMMONIA:5 in the last 168 hours CBC:  Lab 11/17/12 1130  WBC 6.4  NEUTROABS --  HGB 12.9*  HCT 38.4*  MCV 82.9  PLT 242   Cardiac Enzymes:  Lab 11/17/12 1130  CKTOTAL --  CKMB --  CKMBINDEX --  TROPONINI <0.30    BNP (last 3 results) No results found for this basename: PROBNP:3 in the last 8760 hours CBG: No results found for this basename: GLUCAP:5 in the last 168 hours  Radiological Exams on Admission: Dg Chest 2 View  11/17/2012  *RADIOLOGY REPORT*  Clinical Data: Abnormal electrocardiogram  CHEST - 2 VIEW  Comparison: None.  Findings: Lungs clear.  Heart size and pulmonary vascularity are normal.  No adenopathy.  No pneumothorax.  No bone lesions.  IMPRESSION: No abnormality noted.   Original Report Authenticated By: Bretta Bang, M.D.    Ct Angio Chest W/cm &/or Wo Cm  11/17/2012  *RADIOLOGY REPORT*  Clinical Data: Rule out pulmonary embolism, history of prior left leg DVT and 2006  CT ANGIOGRAPHY CHEST  Technique:  Multidetector CT imaging  of the chest using the standard protocol during bolus administration of intravenous contrast. Multiplanar reconstructed images including MIPs were obtained and reviewed to evaluate the vascular anatomy.  Contrast: OMNIPAQUE IOHEXOL 350 MG/ML SOLN  Comparison: Prior CT abdomen/pelvis 01/28/2007.  Findings:  Mediastinum: Unremarkable CT appearance of the thyroid gland.  No suspicious mediastinal or hilar adenopathy.  Small hiatal hernia.  Heart/Vascular: Excellent opacification of the pulmonary arteries to the subsegmental level.  There are central filling defects within the bilateral main pulmonary arteries extending into lobar, segmental and subsegmental pulmonary arteries supplying the right upper, middle and lower lobe as well as in the left upper and lower lobes.  Overall embolic burden greater on the right than the left. There may be mild bowing of the interatrial septum with a reflux of contrast material into the suprahepatic IVC.  No pericardial effusion. Mild cardiomegaly with right atrial enlargement.  Lungs/Pleura: The lungs are clear.  No focal consolidation, pleural effusion or suspicious pulmonary nodule.  There is an isolated blood versus pulmonary cysts in the medial left lower lobe.  Upper Abdomen: A low attenuation cystic lesion in the upper pole of the right kidney measures up to 3.2 cm in diameter which is enlarged compared to 2.3 cm in diameter on the prior study from 2008.  The lesion is incompletely characterized on the present study  Bones: No acute fracture or aggressive appearing lytic or blastic osseous lesion.  IMPRESSION:  1.  CT findings are consistent with sub massive pulmonary embolism. Large volume bilateral pulmonary emboli involving all lobes of both lungs.  Additionally, there is evidence of right heart strain with right atrial dilatation, bowing of the interatrial septum and reflux of contrast into the suprahepatic IVC.  2.  Enlarging cystic lesion in the upper pole right  kidney compared to 2008 is incompletely characterized on the present study.  While this most likely represents a simple cyst, this could be confirmed with a non emergent renal ultrasound when the patient is clinically stable.  Critical Value/emergent results were called by telephone at the time of interpretation on 11/16/2012 at 01:45 p.m. to Dr. Stevie Kern, who verbally acknowledged these results.   Original Report Authenticated By: Malachy Moan, M.D.     EKG: Independently reviewed. NSr with PR 0.2 borderline 1 deg avb, T wave inversions across presocrdium  Assessment/Plan Principal Problem:  *Pulmonary embolism Active Problems:  HYPERLIPIDEMIA  HYPERTENSION  DVT, HX OF  DIABETES-TYPE 2   1. Subacute massive pulmonary embolism-admit to step-down overnight-start heparin drip given renal insufficiency. Start Rivaroxaban in am.  Have already ordered hypercoagulable panel-un sure if he is had DVT in the past or if this was superficial phlebitis in 1997 as he was not placed on anything for this. Hold off on echocardiogram. 2. Hypertension-blood pressure not controlled, start Coreg 3.125 twice a day 3. Metabolic syndrome-needs weight loss  None if consultant consulted, please  document name and whether formally or informally consulted  Code Status: Full  Family Communication: none at bedside  Disposition Plan:  inpatient  Time spent: 82  Mahala Menghini [facility] Pager [phone]  If 7PM-7AM, please contact night-coverage [email or web address] Password [facility] 11/17/2012, 2:47 PM

## 2012-11-17 NOTE — Progress Notes (Signed)
ANTICOAGULATION CONSULT NOTE - Follow-up Consult  Pharmacy Consult for heparin Indication: pulmonary embolus  Allergies  Allergen Reactions  . Shellfish Allergy     REACTION: throat swells  . Shrimp Flavor     REACTION: throat swells    Patient Measurements: Height: 6' (182.9 cm) Weight: 219 lb 12.8 oz (99.7 kg) IBW/kg (Calculated) : 77.6  Heparin Dosing Weight: 86.5 kg  Vital Signs: Temp: 98.3 F (36.8 C) (01/28 1939) Temp src: Oral (01/28 1939) BP: 136/72 mmHg (01/28 1939) Pulse Rate: 91  (01/28 1939)  Labs:  Alvira Philips 11/17/12 2116 11/17/12 1130  HGB -- 12.9*  HCT -- 38.4*  PLT -- 242  APTT -- --  LABPROT -- --  INR -- --  HEPARINUNFRC 0.61 --  CREATININE -- 1.50*  CKTOTAL -- --  CKMB -- --  TROPONINI -- <0.30    Estimated Creatinine Clearance: 76 ml/min (by C-G formula based on Cr of 1.5).   Medical History: Past Medical History  Diagnosis Date  . DVT (deep venous thrombosis)     had LLE dvt in 1997  . Metabolic syndrome     Medications:  Prescriptions prior to admission  Medication Sig Dispense Refill  . aspirin 81 MG tablet Take 81 mg by mouth daily.          Assessment: 47 yo man on IV heparin at 1350 units/hr for PE.  Initial heparin level therapeutic.  No bleeding noted per chart notes.  Goal of Therapy:  Heparin level 0.3-0.7 units/ml Monitor platelets by anticoagulation protocol: Yes   Plan:  Continue IV heparin at current rate. F/U AM heparin level and CBC. Noted plans to transition to Xarelto tomorrow.  Gardner Candle 11/17/2012,10:03 PM

## 2012-11-17 NOTE — ED Notes (Signed)
Pt resting with no complaints, denies any pain or discomfort at this time

## 2012-11-17 NOTE — Progress Notes (Signed)
ANTICOAGULATION CONSULT NOTE - Initial Consult  Pharmacy Consult for heparin Indication: pulmonary embolus  Allergies  Allergen Reactions  . Shellfish Allergy     REACTION: throat swells  . Shrimp Flavor     REACTION: throat swells    Patient Measurements:   Heparin Dosing Weight: 86.5 kg  Vital Signs: Temp: 98.4 F (36.9 C) (01/28 1104) Temp src: Oral (01/28 0921) BP: 156/101 mmHg (01/28 1430) Pulse Rate: 69  (01/28 1430)  Labs:  Basename 11/17/12 1130  HGB 12.9*  HCT 38.4*  PLT 242  APTT --  LABPROT --  INR --  HEPARINUNFRC --  CREATININE 1.50*  CKTOTAL --  CKMB --  TROPONINI <0.30    The CrCl is unknown because both a height and weight (above a minimum accepted value) are required for this calculation.   Medical History: History reviewed. No pertinent past medical history.  Medications:   (Not in a hospital admission)  Assessment: 47 yo man to start heparin for PE Goal of Therapy:  Heparin level 0.3-0.7 units/ml Monitor platelets by anticoagulation protocol: Yes   Plan:  Heparin bolus 5000 units and drip at 1350 units/hr. Check heparin level 6 hours after start and daily while on heparin.  Ladell Lea Poteet 11/17/2012,2:52 PM

## 2012-11-17 NOTE — Patient Instructions (Addendum)
Because of EKG abnormality we will transport you emergently to come hospital

## 2012-11-17 NOTE — Progress Notes (Signed)
  Subjective:    Patient ID: Joshua Castaneda, male    DOB: 25-Sep-1966, 47 y.o.   MRN: [id number]  HPI Joshua Castaneda is a 47 year old male nonsmoker who comes in today with a two-week history of shortness of breath  He states he normally works out every evening on his home treadmill about 80 minutes. 2 weeks ago he began having shortness of breath after about 30 minutes of exercise. He denied any chest pain he didn't have to stop he does load down for a short period time and continue good workout. He does have a history of allergic rhinitis. He's also noticed some shortness of breath when he climbs stairs. 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  He has stopped his Glucotrol lisinopril metformin Zocor. He lost on a diet and exercise program and was trying to see if he can control the above problems with diet and exercise. His blood sugar today is normal. BP today 130/90.   Review of Systems Review of systems otherwise negative    Objective:   Physical Exam Well-developed well-nourished male no acute distress cardiopulmonary exam normal blood sugar,,,,,,,,,,,,, EKG today says a significant change with marked T-wave depression in V2 and 3.  In phone consultation with Dr. Jens Som he concurs with the EKG findings. Recommend transport to [facility] for emergent cardiac evaluation      Assessment & Plan:  Shortness of breath with exertion question LAD lesion plan....... transport by EMS to [facility] cardiac evaluation today

## 2012-11-18 ENCOUNTER — Inpatient Hospital Stay (HOSPITAL_COMMUNITY): Payer: BC Managed Care – PPO

## 2012-11-18 DIAGNOSIS — Z86718 Personal history of other venous thrombosis and embolism: Secondary | ICD-10-CM

## 2012-11-18 DIAGNOSIS — I2699 Other pulmonary embolism without acute cor pulmonale: Secondary | ICD-10-CM

## 2012-11-18 DIAGNOSIS — E119 Type 2 diabetes mellitus without complications: Secondary | ICD-10-CM

## 2012-11-18 DIAGNOSIS — R0602 Shortness of breath: Secondary | ICD-10-CM

## 2012-11-18 DIAGNOSIS — I10 Essential (primary) hypertension: Secondary | ICD-10-CM

## 2012-11-18 LAB — COMPREHENSIVE METABOLIC PANEL
AST: 18 U/L (ref 0–37)
CO2: 28 mEq/L (ref 19–32)
Calcium: 9.3 mg/dL (ref 8.4–10.5)
Creatinine, Ser: 1.4 mg/dL — ABNORMAL HIGH (ref 0.50–1.35)
GFR calc non Af Amer: 59 mL/min — ABNORMAL LOW (ref >90)

## 2012-11-18 LAB — LUPUS ANTICOAGULANT PANEL
Lupus Anticoagulant: NOT DETECTED
PTT Lupus Anticoagulant: 36.8 secs (ref 28.0–43.0)

## 2012-11-18 LAB — PROTIME-INR
INR: 1.03 (ref 0.00–1.49)
Prothrombin Time: 13.4 seconds (ref 11.6–15.2)

## 2012-11-18 LAB — BETA-2-GLYCOPROTEIN I ABS, IGG/M/A
Beta-2 Glyco I IgG: 3 G Units (ref <20)
Beta-2-Glycoprotein I IgM: 23 M Units — ABNORMAL HIGH (ref <20)

## 2012-11-18 LAB — CBC
MCV: 83.3 fL (ref 78.0–100.0)
Platelets: 262 10*3/uL (ref 150–400)
RDW: 14.3 % (ref 11.5–15.5)
WBC: 6 10*3/uL (ref 4.0–10.5)

## 2012-11-18 LAB — PROTEIN S, TOTAL: Protein S Ag, Total: 82 % (ref 60–150)

## 2012-11-18 LAB — PROTEIN C, TOTAL: Protein C, Total: 72 % (ref 72–160)

## 2012-11-18 LAB — PROTEIN C ACTIVITY: Protein C Activity: 124 % (ref 75–133)

## 2012-11-18 NOTE — Progress Notes (Addendum)
[facility] Progress Note [place] TEAM 1 - Stepdown/ICU TEAM   Joshua Castaneda XBJ:[id number] DOB: [birth year] DOA: 11/17/2012 PCP: Evette Georges, MD  Brief narrative: 47 y.o. male w/ hx of spontenaous L LE DVT in ~1996 who started to develop increasing WOB with minimal exertion such as walking upstairs over a 2 week period. Usually very active - exercises at home.  Non-smoker. No h/o cancer.  No cough,fever chills.  NO CP.  Went to [employer] office and because of ST/T wave inversions on EKG without any known prior issue and normal EKG in 2012 was referred emergently to emergency room for further workup.  CT scan chest in ER showed submassive pulmonary embolism - large volume bilateral + right heart strain.   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 - anticoag panel pending, but studies will need to be repeated when not on anticoag and after acute event resolved if felt to be indicated - will need at minimum 1 year of anticoag if all studies negative, vs/ life long if studies inconclusive or positive - will ask CM to investigate "affordability" of Xarelto in this active working gentleman  Acute renal injury  crt improving w/ volume - likely due to poor perfusion in setting of large B PE - recheck in AM  Enlarging cystic lesion in the upper pole right kidney compared to 2008 Most c/w cyst - will obtain US as suggested to better evaluate - pt reports prior extensive w/u for hematuria via a Urologist, with no conclusive result   HTN Has hx of previously being on meds, but w/ lifestyle change was able to d/c - current elevation could be related to stress of PE - follow w/o change today  HLD Previously diagnosed by his PCP - not on meds - suspect improved w/  lifestyle change  DM Diet controlled - previosly on glucotrol + metformin - check CBG BID and A1c  Code Status: FULL Family Communication: spoke w/ pt and wife at bedside at length  Disposition Plan: SDU  Consultants: none  Procedures: none  Antibiotics: none  DVT prophylaxis: Full anticoag  HPI/Subjective: Pt is resting comfortably.  No chest pain or sob at rest.  No abdom pain, n/v, sob, or LE pain.   Objective: Blood pressure 142/96, pulse 61, temperature 97.7 F (36.5 C), temperature source Oral, resp. rate 17, height 6' (1.829 m), weight 99.7 kg (219 lb 12.8 oz), SpO2 96.00%.  Intake/Output Summary (Last 24 hours) at 11/18/12 1434 Last data filed at 11/18/12 1238  Gross per 24 hour  Intake    522 ml  Output   1000 ml  Net   -478 ml     Exam: General: No acute respiratory distress at rest  Lungs: Clear to auscultation bilaterally without wheezes or crackles Cardiovascular: Regular rate and rhythm without murmur gallop or rub normal S1 and S2 Abdomen: Nontender, nondistended, soft, bowel sounds positive, no rebound, no ascites, no appreciable mass Extremities: No significant cyanosis, clubbing, or edema bilateral lower extremities - changes c/w hemosiderin staining to L LE   Data Reviewed: Basic Metabolic Panel:  Lab 11/18/12 3086 11/17/12 1130  NA 141 140  K 4.6 3.9  CL 104 103  CO2 28 28  GLUCOSE 94 89  BUN 15 18  CREATININE 1.40* 1.50*  CALCIUM 9.3 9.5  MG -- --  PHOS -- --   Liver Function Tests:  Lab 11/18/12 0440  AST 18  ALT 13  ALKPHOS 76  BILITOT 0.7  PROT 6.6  ALBUMIN 3.5   CBC:  Lab 11/18/12 0440 11/17/12 1130  WBC 6.0 6.4  NEUTROABS -- --  HGB 13.0 12.9*  HCT 38.8* 38.4*  MCV 83.3 82.9  PLT 262 242   Cardiac Enzymes:  Lab 11/17/12 1130  CKTOTAL --  CKMB --  CKMBINDEX --  TROPONINI <0.30    Recent Results (from the past 240 hour(s))  MRSA PCR SCREENING     Status: Normal   Collection Time   11/17/12  6:30 PM       Component Value Range Status Comment   MRSA by PCR NEGATIVE  NEGATIVE Final      Studies:  Recent x-ray studies have been reviewed in detail by the Attending Physician  Scheduled Meds:  Reviewed in detail by the Attending Physician   Lonia Blood, MD [facility] Office  [phone] Pager [phone]  On-Call/Text Page:      [email or web address]      password [email or web address]  If 7PM-7AM, please contact night-coverage [email or web address] Password [facility] 11/18/2012, 2:34 PM   LOS: 1 day

## 2012-11-18 NOTE — Progress Notes (Signed)
Right:  No evidence of DVT, superficial thrombosis, or Baker's cyst.  Left: DVT noted in the popliteal vein.  No evidence of superficial thrombosis.  No Baker's cyst.

## 2012-11-18 NOTE — Progress Notes (Signed)
ANTICOAGULATION CONSULT NOTE - Follow-up Consult  Pharmacy Consult for heparin Indication: pulmonary embolus  Allergies  Allergen Reactions  . Shellfish Allergy     REACTION: throat swells  . Shrimp Flavor     REACTION: throat swells    Patient Measurements: Height: 6' (182.9 cm) Weight: 219 lb 12.8 oz (99.7 kg) IBW/kg (Calculated) : 77.6  Heparin Dosing Weight: 86.5 kg  Vital Signs: Temp: 97.6 F (36.4 C) (01/29 0353) Temp src: Oral (01/29 0353) BP: 146/93 mmHg (01/29 0353) Pulse Rate: 66  (01/29 0353)  Labs:  Basename 11/18/12 0440 11/17/12 2116 11/17/12 1130  HGB 13.0 -- 12.9*  HCT 38.8* -- 38.4*  PLT 262 -- 242  APTT -- -- --  LABPROT 13.4 -- --  INR 1.03 -- --  HEPARINUNFRC 0.55 0.61 --  CREATININE 1.40* -- 1.50*  CKTOTAL -- -- --  CKMB -- -- --  TROPONINI -- -- <0.30    Estimated Creatinine Clearance: 81.4 ml/min (by C-G formula based on Cr of 1.4).   Medical History: Past Medical History  Diagnosis Date  . DVT (deep venous thrombosis)     had LLE dvt in 1997  . Metabolic syndrome     Medications:  Prescriptions prior to admission  Medication Sig Dispense Refill  . aspirin 81 MG tablet Take 81 mg by mouth daily.          Assessment: 47 yo man on IV heparin at 1350 units/hr for PE.  AM heparin level therapeutic.  No bleeding noted per chart notes.  Goal of Therapy:  Heparin level 0.3-0.7 units/ml Monitor platelets by anticoagulation protocol: Yes   Plan:  Continue IV heparin at current rate. Stop heparin when Xarelto initiated this evening.  Mickeal Skinner 11/18/2012,7:16 AM

## 2012-11-18 NOTE — ED Provider Notes (Signed)
I saw and evaluated the patient, reviewed the resident's note and I agree with the findings and plan. I have reviewed EKG and agree with the resident interpretation.  Pt with worsening exertional dyspnea but denies CP.  New EKG changes.  Pt is symptom free at baseline.  Concern for PE.  CTA shows PE in all lobes.  Started anticoagulation and admitted.  Gwyneth Sprout, MD 11/18/12 [phone]

## 2012-11-19 DIAGNOSIS — Q619 Cystic kidney disease, unspecified: Secondary | ICD-10-CM

## 2012-11-19 DIAGNOSIS — N281 Cyst of kidney, acquired: Secondary | ICD-10-CM

## 2012-11-19 LAB — CBC
Hemoglobin: 12.4 g/dL — ABNORMAL LOW (ref 13.0–17.0)
MCH: 27.9 pg (ref 26.0–34.0)
RBC: 4.45 MIL/uL (ref 4.22–5.81)
WBC: 4.5 10*3/uL (ref 4.0–10.5)

## 2012-11-19 LAB — HEMOGLOBIN A1C
Hgb A1c MFr Bld: 6 % — ABNORMAL HIGH (ref <5.7)
Mean Plasma Glucose: 126 mg/dL — ABNORMAL HIGH (ref <117)

## 2012-11-19 LAB — BASIC METABOLIC PANEL
GFR calc Af Amer: 73 mL/min — ABNORMAL LOW (ref >90)
GFR calc non Af Amer: 63 mL/min — ABNORMAL LOW (ref >90)
Glucose, Bld: 101 mg/dL — ABNORMAL HIGH (ref 70–99)
Potassium: 4.1 mEq/L (ref 3.5–5.1)
Sodium: 140 mEq/L (ref 135–145)

## 2012-11-19 LAB — GLUCOSE, CAPILLARY: Glucose-Capillary: 81 mg/dL (ref 70–99)

## 2012-11-19 LAB — FACTOR 5 LEIDEN

## 2012-11-19 MED ORDER — RIVAROXABAN 15 MG PO TABS: 15.0000 mg | ORAL_TABLET | Freq: Every day | ORAL | Status: DC

## 2012-11-19 MED ORDER — RIVAROXABAN 15 MG PO TABS: 15.0000 mg | ORAL_TABLET | Freq: Two times a day (BID) | ORAL | Status: DC

## 2012-11-19 MED ORDER — RIVAROXABAN 20 MG PO TABS: 20.0000 mg | ORAL_TABLET | Freq: Every day | ORAL | Status: DC

## 2012-11-19 NOTE — Progress Notes (Signed)
Dr.RIZWAN down explained and answered all patients question also she had pharm to come up and go over med see notes pt satisfied and ready to go all discharge instruction understood , RX given with work excuse  And FU orders out with volunteers in wheelchair/home with wife

## 2012-11-19 NOTE — Progress Notes (Signed)
Patient   Walked  In hallway sat RA was 99% , no SOB noted / [employer]

## 2012-11-19 NOTE — Progress Notes (Signed)
Text MD(Rizwan) for patient   Has concerns about returning to work and family sister who is a Engineer, civil (consulting) has questions for her before discharge home

## 2012-11-19 NOTE — Progress Notes (Signed)
Pt says sister stills has not heard from MD at nurses station wanting to speak with supervisor of unit explained to pt he has had discharge MD(Rizwan ) was called and that he would be discharge this afternoon. Sister wants supervisor because she has not returned her call and pt seems to question his discharge since he stills has blood clot according  Eller charge nurse in room to talk to Patient and family

## 2012-11-19 NOTE — Progress Notes (Signed)
While waiting for MD [facility]) pt was given her discharge instructions to read to see and to help with any other questions that she has ELLER(charge nurse did talk with sister  aand followed up with recalling MD.

## 2012-11-19 NOTE — Progress Notes (Signed)
Utilization Review Completed.  11/19/2012  

## 2012-11-20 NOTE — Care Management Note (Signed)
    Page 1 of 1   11/20/2012     9:33:26 AM   CARE MANAGEMENT NOTE 11/20/2012  Patient:  Joshua Castaneda, Joshua Castaneda   Account Number:  [id number]  Date Initiated:  11/20/2012  Documentation initiated by:  Alvira Philips Assessment:   47 yr-old male adm with dx of PE     DC Planning Services  CM consult      Status of service:  Completed, signed off  Discharge Disposition:  HOME/SELF CARE  Per UR Regulation:  Reviewed for med. necessity/level of care/duration of stay  Comments:  11/19/12 1210 Suhani Stillion RN BSN MSN CCM Pt will d/c on Xarelto.  Per CMA, copay will be $80/month. Discussed with pt who indicates he will be able to afford copay. Provided card for free 10-day supply, MD will include script.

## 2012-11-20 NOTE — H&P (Signed)
[facility] History and Physical  Yehia Mcbain WUJ:[id number] DOB: 07-31-1966 DOA: 11/17/2012  Referring physician: EDP PCP: Evette Georges, MD  Specialists: None  Chief Complaint: CP + SOB  HPI: Redmond Whittley is a 47 y.o. male who started to develop increasing WOB with minimal exertion such as walking upstairs.  Usually very active exercises at home.  He went to get checked out at Dr. Vinson Moselle noticed this only for the paast 2 weeks.  Not on Testosterone supplements.  Non-smoker.  No h/o cancer No cough,fever chills NO CP  Went to PCP's office and because of ST T wave inversions on EKG without any known prior issue and normal EKG in 2012 was referred emergently to emergency room for further workup  CT scan chest showed submassive pulmonary embolism, large volume bilateral prominent ligating although with both lungs + right heart strain greater than patient going of the interatrial septum and reflux of contrast. Enlarging cystic lesion of the upper pole right kidney notable. 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 DVT (deep venous thrombosis)     had LLE dvt in 1997  . Metabolic syndrome    History reviewed. No pertinent past surgical history. Social History:  reports that he has never smoked. He has never used smokeless tobacco. He reports that he does not drink alcohol or use illicit drugs.  Allergies  Allergen Reactions  . Shellfish Allergy     REACTION: throat swells  . Shrimp Flavor     REACTION: throat swells    Family History  Problem Relation Age of Onset  . Heart failure Father   . Stroke Mother   . Hypertension Sister   . Hypertension Brother      Prior to Admission medications   Medication Sig Start Date End Date Taking? Authorizing Provider  aspirin 81  MG tablet Take 81 mg by mouth daily.     Yes Historical Provider, MD   Physical Exam: Filed Vitals:   11/19/12 0741 11/19/12 0815 11/19/12 1007 11/19/12 1127  BP: 162/94 161/99  149/84  Pulse: 53  67 61  Temp: 97.6 F (36.4 C)  97.7 F (36.5 C) 98.3 F (36.8 C)  TempSrc: Oral  Oral Oral  Resp: 16  15 18   Height:      Weight:      SpO2: 100% 100% 99% 98%     General:  Alert pleasant NAD  Eyes: eomi, no pallor/ict  ENT: nad  Neck: soft supple/NT  Cardiovascular: s1 s2 no m/r/g-slightly tachycardic  Respiratory: clear, no tvr/f  Abdomen: soft/nt/nd  Skin: soft, no edema  Musculoskeletal: rom intact   Psychiatric: euthymic  Neurologic: grossly intact  Labs on Admission:  Basic Metabolic Panel:  Lab 11/19/12 9562 11/18/12 0440 11/17/12 1130  NA 140 141 140  K 4.1 4.6 3.9  CL 104 104 103  CO2 28 28 28   GLUCOSE 101* 94 89  BUN 14 15 18   CREATININE 1.33 1.40* 1.50*  CALCIUM 9.2 9.3 9.5  MG -- -- --  PHOS -- -- --   Liver Function Tests:  Lab 11/18/12 0440  AST 18  ALT 13  ALKPHOS 76  BILITOT 0.7  PROT 6.6  ALBUMIN 3.5   No results found for this basename: LIPASE:5,AMYLASE:5 in the last 168 hours No results found for  this basename: AMMONIA:5 in the last 168 hours CBC:  Lab 11/19/12 0425 11/18/12 0440 11/17/12 1130  WBC 4.5 6.0 6.4  NEUTROABS -- -- --  HGB 12.4* 13.0 12.9*  HCT 37.3* 38.8* 38.4*  MCV 83.8 83.3 82.9  PLT 248 262 242   Cardiac Enzymes:  Lab 11/17/12 1130  CKTOTAL --  CKMB --  CKMBINDEX --  TROPONINI <0.30    BNP (last 3 results) No results found for this basename: PROBNP:3 in the last 8760 hours CBG:  Lab 11/19/12 0812  GLUCAP 81    Radiological Exams on Admission: US Renal  11/19/2012  *RADIOLOGY REPORT*  Clinical Data: Cystic area of the upper right kidney demonstrating enlargement since 2008 by CT.  The  RENAL/URINARY TRACT ULTRASOUND COMPLETE  Comparison:  Chest CT on 11/17/2012 and abdominal CT on 01/28/2007.   Findings:  Right Kidney:  The right kidney measures approximately 10.3 cm in length. The renal cortex is felt to be mildly echogenic without evidence of cortical atrophy.  This may be reflective of some degree of underlying chronic kidney disease.  A well delineated simple cyst of the upper pole measures approximately 3.4 x 3.1 x 2.9 cm.  No other right-sided renal lesions are identified.  There is no evidence of hydronephrosis.  Left Kidney:  The left kidney has a normal sonographic appearance and measures approximately 10.1 cm.  Similar to the right side, the renal cortex is mildly and diffusely echogenic.  No significant atrophy identified.  No evidence of left renal lesion or hydronephrosis.  Bladder:  The bladder is decompressed.  IMPRESSION:  1.  The upper pole cyst of the right kidney measures 3.4 cm in greatest diameter by ultrasound and has a simple and benign appearance. 2.  Both kidneys show suggestion of increased cortical echogenicity which may reflect a degree of underlying chronic kidney disease. There is no evidence of significant cortical atrophy or renal obstruction bilaterally.   Original Report Authenticated By: Irish Lack, M.D.     EKG: Independently reviewed. NSr with PR 0.2 borderline 1 deg avb, T wave inversions across presocrdium  Assessment/Plan Principal Problem:  *Pulmonary emboli with right heart strain Active Problems:  HYPERTENSION  DVT, HX OF  Renal cyst   1. Subacute massive pulmonary embolism-admit to step-down overnight-start heparin drip given renal insufficiency. Start Rivaroxaban in am.  Have already ordered hypercoagulable panel-un sure if he is had DVT in the past or if this was superficial phlebitis in 1997 as he was not placed on anything for this. Hold off on echocardiogram. 2. Hypertension-blood pressure not controlled, start Coreg 3.125 twice a day 3. Metabolic syndrome-needs weight loss  None if consultant consulted, please document name and whether  formally or informally consulted  Code Status: Full  Family Communication: none at bedside  Disposition Plan:  inpatient  Time spent: 9  Mahala Menghini [facility] Pager [phone]  If 7PM-7AM, please contact night-coverage [email or web address] Password [facility] 11/20/2012, 8:49 AM

## 2012-11-23 NOTE — Discharge Summary (Addendum)
Physician Discharge Summary  Joshua Castaneda ZOX:[id number] DOB: Sep 07, 1966 DOA: 11/17/2012  PCP: Evette Georges, MD  Admit date: 11/17/2012 Discharge date: 11/23/2012  Time spent: >45 minutes  Discharge Diagnoses:  Principal Problem:  *Pulmonary emboli with right heart strain Left leg DVT in popliteal vein Active Problems:  HYPERTENSION  DVT, HX OF  Renal cyst diet controlled diabetes  Discharge Condition: stable  Diet recommendation: heart healthy, diabetic diet  Filed Weights   11/17/12 1700 11/17/12 1731 11/19/12 0000  Weight: 104.3 kg (229 lb 15 oz) 99.7 kg (219 lb 12.8 oz) 99.8 kg (220 lb 0.3 oz)    History of present illness:  47 y.o. male w/ hx of spontenaous L LE DVT in ~1996 who started to develop increasing WOB with minimal exertion such as walking upstairs over a 2 week period. Usually very active - exercises at home. Non-smoker. No h/o cancer. No cough,fever chills. NO CP.  Went to [employer] office and because of ST/T wave inversions on EKG without any known prior issue and normal EKG in 2012 was referred emergently to emergency room for further workup.  CT scan chest in ER showed submassive pulmonary embolism - large volume bilateral + right heart strain.      Hospital Course:  Acute B PE (large clot burden) - DVT in L popliteal vein - hx of prior L LE DVT  Pt will almost assuredly need life-long anticoag, given that this represents his second "unprovoked" clotting event - he reports a hx of "recurring phlebitis" in his father - anticoag panel pending, but studies will need to be repeated when not on anticoag and after acute event resolved if felt to be indicated - I have referred him to hematology with an appt made for him. He is able to afford Xarelto and is tolerating it well.  O2 sats are 99 % even with exertion. He has received education regarding PE/DVT and Xarelto.  Acute renal injury  crt was mildly elevated at 1.5 and has returned to normal   Enlarging  cystic lesion in the upper pole right kidney compared to 2008  Noted on CT- Most c/w cyst - ultrasound confirms this  HTN  Has hx of previously being on meds, but w/ lifestyle change was able to d/c - current elevation could be related to stress of PE - have recommended he f/u with his PCP in 1-2 weeks for a BP check.   HLD  Previously diagnosed by his PCP - not on meds - suspect improved w/ lifestyle change   DM  Diet controlled - previosly on glucotrol + metformin - A1c 6.0    Discharge Exam: Filed Vitals:   11/19/12 0741 11/19/12 0815 11/19/12 1007 11/19/12 1127  BP: 162/94 161/99  149/84  Pulse: 53  67 61  Temp: 97.6 F (36.4 C)  97.7 F (36.5 C) 98.3 F (36.8 C)  TempSrc: Oral  Oral Oral  Resp: 16  15 18   Height:      Weight:      SpO2: 100% 100% 99% 98%    General: alert no distress Cardiovascular: RRR, no murmurs Respiratory: CTA b/l   Discharge Instructions  Discharge Orders    Future Appointments: Provider: Department: Dept Phone: Center:   12/02/2012 10:30 AM [facility] MEDICAL ONCOLOGY [phone] None   12/02/2012 10:45 AM Krista Blue [facility] MEDICAL ONCOLOGY [phone] None   12/02/2012 11:00 AM Exie Parody, MD [facility] MEDICAL ONCOLOGY [phone]  None     Future Orders Please Complete By Expires   Diet - low sodium heart healthy      Increase activity slowly      Discharge instructions      Comments:   Keep very well hydrated.  See your doctor in 1-2 weeks for BP check.       Medication List     As of 11/23/2012  8:37 AM    STOP taking these medications         aspirin 81 MG tablet      TAKE these medications         Rivaroxaban 15 MG Tabs tablet   Commonly known as: XARELTO   Take 1 tablet (15 mg total) by mouth 2 (two) times daily with a meal.            Rivaroxaban 20 MG Tabs   Commonly known as: XARELTO   Take 1 tablet (20 mg total) by mouth  daily.   Start taking on: 12/10/2012           Follow-up Information    Follow up with [facility], [place]. (Feb 12th- 10:30)    Contact information:   [address] [place] [address] [phone]           The results of significant diagnostics from this hospitalization (including imaging, microbiology, ancillary and laboratory) are listed below for reference.    Significant Diagnostic Studies: Dg Chest 2 View  11/17/2012  *RADIOLOGY REPORT*  Clinical Data: Abnormal electrocardiogram  CHEST - 2 VIEW  Comparison: None.  Findings: Lungs clear.  Heart size and pulmonary vascularity are normal.  No adenopathy.  No pneumothorax.  No bone lesions.  IMPRESSION: No abnormality noted.   Original Report Authenticated By: Bretta Bang, M.D.    Ct Angio Chest W/cm &/or Wo Cm  11/17/2012  *RADIOLOGY REPORT*  Clinical Data: Rule out pulmonary embolism, history of prior left leg DVT and 2006  CT ANGIOGRAPHY CHEST  Technique:  Multidetector CT imaging of the chest using the standard protocol during bolus administration of intravenous contrast. Multiplanar reconstructed images including MIPs were obtained and reviewed to evaluate the vascular anatomy.  Contrast: OMNIPAQUE IOHEXOL 350 MG/ML SOLN  Comparison: Prior CT abdomen/pelvis 01/28/2007.  Findings:  Mediastinum: Unremarkable CT appearance of the thyroid gland.  No suspicious mediastinal or hilar adenopathy.  Small hiatal hernia.  Heart/Vascular: Excellent opacification of the pulmonary arteries to the subsegmental level.  There are central filling defects within the bilateral main pulmonary arteries extending into lobar, segmental and subsegmental pulmonary arteries supplying the right upper, middle and lower lobe as well as in the left upper and lower lobes.  Overall embolic burden greater on the right than the left. There may be mild bowing of the interatrial septum with a reflux of contrast material into the suprahepatic IVC.  No  pericardial effusion. Mild cardiomegaly with right atrial enlargement.  Lungs/Pleura: The lungs are clear.  No focal consolidation, pleural effusion or suspicious pulmonary nodule.  There is an isolated blood versus pulmonary cysts in the medial left lower lobe.  Upper Abdomen: A low attenuation cystic lesion in the upper pole of the right kidney measures up to 3.2 cm in diameter which is enlarged compared to 2.3 cm in diameter on the prior study from 2008.  The lesion is incompletely characterized on the present study  Bones: No acute fracture or aggressive appearing lytic or blastic osseous lesion.  IMPRESSION:  1.  CT findings  are consistent with sub massive pulmonary embolism. Large volume bilateral pulmonary emboli involving all lobes of both lungs.  Additionally, there is evidence of right heart strain with right atrial dilatation, bowing of the interatrial septum and reflux of contrast into the suprahepatic IVC.  2.  Enlarging cystic lesion in the upper pole right kidney compared to 2008 is incompletely characterized on the present study.  While this most likely represents a simple cyst, this could be confirmed with a non emergent renal ultrasound when the patient is clinically stable.  Critical Value/emergent results were called by telephone at the time of interpretation on 11/16/2012 at 01:45 p.m. to Dr. Stevie Kern, who verbally acknowledged these results.   Original Report Authenticated By: Malachy Moan, M.D.    US Renal  11/19/2012  *RADIOLOGY REPORT*  Clinical Data: Cystic area of the upper right kidney demonstrating enlargement since 2008 by CT.  The  RENAL/URINARY TRACT ULTRASOUND COMPLETE  Comparison:  Chest CT on 11/17/2012 and abdominal CT on 01/28/2007.  Findings:  Right Kidney:  The right kidney measures approximately 10.3 cm in length. The renal cortex is felt to be mildly echogenic without evidence of cortical atrophy.  This may be reflective of some degree of underlying chronic kidney  disease.  A well delineated simple cyst of the upper pole measures approximately 3.4 x 3.1 x 2.9 cm.  No other right-sided renal lesions are identified.  There is no evidence of hydronephrosis.  Left Kidney:  The left kidney has a normal sonographic appearance and measures approximately 10.1 cm.  Similar to the right side, the renal cortex is mildly and diffusely echogenic.  No significant atrophy identified.  No evidence of left renal lesion or hydronephrosis.  Bladder:  The bladder is decompressed.  IMPRESSION:  1.  The upper pole cyst of the right kidney measures 3.4 cm in greatest diameter by ultrasound and has a simple and benign appearance. 2.  Both kidneys show suggestion of increased cortical echogenicity which may reflect a degree of underlying chronic kidney disease. There is no evidence of significant cortical atrophy or renal obstruction bilaterally.   Original Report Authenticated By: Irish Lack, M.D.     Microbiology: Recent Results (from the past 240 hour(s))  MRSA PCR SCREENING     Status: Normal   Collection Time   11/17/12  6:30 PM      Component Value Range Status Comment   MRSA by PCR NEGATIVE  NEGATIVE Final      Labs: Basic Metabolic Panel:  Lab 11/19/12 8119 11/18/12 0440 11/17/12 1130  NA 140 141 140  K 4.1 4.6 3.9  CL 104 104 103  CO2 28 28 28   GLUCOSE 101* 94 89  BUN 14 15 18   CREATININE 1.33 1.40* 1.50*  CALCIUM 9.2 9.3 9.5  MG -- -- --  PHOS -- -- --   Liver Function Tests:  Lab 11/18/12 0440  AST 18  ALT 13  ALKPHOS 76  BILITOT 0.7  PROT 6.6  ALBUMIN 3.5   No results found for this basename: LIPASE:5,AMYLASE:5 in the last 168 hours No results found for this basename: AMMONIA:5 in the last 168 hours CBC:  Lab 11/19/12 0425 11/18/12 0440 11/17/12 1130  WBC 4.5 6.0 6.4  NEUTROABS -- -- --  HGB 12.4* 13.0 12.9*  HCT 37.3* 38.8* 38.4*  MCV 83.8 83.3 82.9  PLT 248 262 242   Cardiac Enzymes:  Lab 11/17/12 1130  CKTOTAL --  CKMB --   CKMBINDEX --  TROPONINI <0.30  BNP: BNP (last 3 results) No results found for this basename: PROBNP:3 in the last 8760 hours CBG:  Lab 11/19/12 0812  GLUCAP 81       Signed:  Marquisa Salih  [facility] 11/23/2012, 8:37 AM

## 2012-12-01 ENCOUNTER — Telehealth: Payer: Self-pay | Admitting: Oncology

## 2012-12-01 NOTE — Telephone Encounter (Signed)
C/D 12/01/12 for appt 12/02/12

## 2012-12-01 NOTE — Telephone Encounter (Signed)
Dx DVT

## 2012-12-02 ENCOUNTER — Encounter: Payer: Self-pay | Admitting: Oncology

## 2012-12-02 ENCOUNTER — Other Ambulatory Visit (HOSPITAL_BASED_OUTPATIENT_CLINIC_OR_DEPARTMENT_OTHER): Payer: BC Managed Care – PPO | Admitting: Lab

## 2012-12-02 ENCOUNTER — Ambulatory Visit: Payer: BC Managed Care – PPO

## 2012-12-02 ENCOUNTER — Ambulatory Visit (HOSPITAL_BASED_OUTPATIENT_CLINIC_OR_DEPARTMENT_OTHER): Payer: BC Managed Care – PPO | Admitting: Oncology

## 2012-12-02 VITALS — BP 150/85 | HR 75 | Temp 97.7°F | Resp 20 | Ht 72.0 in | Wt 229.1 lb

## 2012-12-02 DIAGNOSIS — Z86718 Personal history of other venous thrombosis and embolism: Secondary | ICD-10-CM

## 2012-12-02 DIAGNOSIS — I2699 Other pulmonary embolism without acute cor pulmonale: Secondary | ICD-10-CM

## 2012-12-02 LAB — PROTIME-INR

## 2012-12-02 NOTE — Patient Instructions (Addendum)
1.  Issue:  Recurrent blood clot; and recently with pulmonary embolism. 2.  Cause:  Unknown.  Extensive hypercoagulable work up did not show obvious reason.  About 30% of patients with recurrent clots have no obvious explanation. 3.  Duration of anticoagulation:  Life long due to recurrent clot, and serious clot in the lungs. 4.  Choice blood thinner:  Either Coumadin vs. Xarelto.    *  Coumadin:  Cheap, and presence of antidotes.  Downsides:  Have to check INR on a routine basis; effect of diet and other drugs.  *  Xarelto:  More expensive; slightly lower risk of bleeding.  Downsides:  No antidotes, expensive.   5.  Follow up:  Where to check INR: either here with with primary care physician.

## 2012-12-02 NOTE — Progress Notes (Signed)
Checked in new pt with no financial concerns. °

## 2012-12-02 NOTE — Progress Notes (Signed)
[facility]  Telephone:([phone] Fax:([phone]     INITIAL HEMATOLOGY CONSULTATION    Referral MD:  Dr. Eugenio Hoes. Tawanna Cooler, M.D.  Reason for Referral: bilateral PE.     HPI:  Joshua Castaneda is a 47 year-old [place] American man with history of left lower extremity DVT in 1997.  This was nonprovoked.  He was evaluated; however was not placed on anticoagulation.  He did wear compression stocking for a while due to edema.  He has been doing well until about 3 weeks prior to presentation.  He exercises on his treadmill about 1-1.5 hour daily.  Then he developed DOE after of tread mill which was very unusual for him, or with climbing stairs.  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  However, after a few days of not working, these symptoms worsened.  Therefore, he presented to Dr. Tawanna Cooler on 11/17/2012.  EKG showed new abnormal findings of T-wave inversions in V2 and V3.  He was referred to ED.  CT angio showed sub massive pulmonary embolism.  There was large volume bilateral pulmonary emboli involving all lobes of both lungs. There was evidence of right heart strain with right atrial dilatation, bowing of the interatrial septum.  I personally reviewed the images of this CT angio today.  He also had Doppler US which showed chronic left popliteal DVT but no acute DVT.  He was placed on Xarelto and discharged home.  He was kindly referred to the Cancer for evaluation.  Joshua Castaneda presented to the clinic with his wife to the Cancer Center today with his wife. He reported that his DOE has significantly improved.  He was able to do treadmill for 1 hour without problem.  He did not have any new swelling in the leg except for chronic left lower extremity swelling since 1997.  He is due to change from bid Xarelto to daily Xarelto soon.  He has not had any bleeding complication from Xarelto.   Patient denies fever, anorexia, weight loss, fatigue,  headache, visual changes, confusion, drenching night sweats, palpable lymph node swelling, mucositis, odynophagia, dysphagia, nausea vomiting, jaundice, chest pain, palpitation, shortness of breath, dyspnea on exertion, productive cough, gum bleeding, epistaxis, hematemesis, hemoptysis, abdominal pain, abdominal swelling, early satiety, melena, hematochezia, hematuria, skin rash, spontaneous bleeding, joint swelling, joint pain, heat or cold intolerance, bowel bladder incontinence, back pain, focal motor weakness, paresthesia, depression.      Past Medical History  Diagnosis Date  . DVT (deep venous thrombosis)     had LLE dvt in 1997; no clear provocation.   . Metabolic syndrome   . Hematuria     neg work up with Urology.   . Hypertension   :    No past surgical history on file.:   CURRENT MEDS: Current Outpatient Prescriptions  Medication Sig Dispense Refill  . Rivaroxaban (XARELTO) 15 MG TABS tablet Take 1 tablet (15 mg total) by mouth 2 (two) times daily with a meal.  40 tablet  0  . Rivaroxaban (XARELTO) 15 MG TABS tablet Take 15 mg by mouth daily.      Melene Muller ON 12/10/2012] Rivaroxaban (XARELTO) 20 MG TABS Take 1 tablet (20 mg total) by mouth daily.  30 tablet  0   No current facility-administered medications for this visit.      Allergies  Allergen Reactions  . Shellfish Allergy     REACTION: throat swells  . Shrimp Flavor  REACTION: throat swells  :  Family History  Problem Relation Age of Onset  . Heart failure Father   . Stroke Mother   . Aneurysm Mother   . Hypertension Sister   . Hypertension Brother   :  History   Social History  . Marital Status: Single    Spouse Name: N/A    Number of Children: 1  . Years of Education: N/A   Occupational History  .  [employer]    active the field.    Social History Main Topics  . Smoking status: Never Smoker   . Smokeless tobacco: Never Used  . Alcohol Use: No  . Drug Use: No  . Sexually  Active: Not on file   Other Topics Concern  . Not on file   Social History Narrative   wortks with [employer]   Originally form [place]  Has wife and kids liv sin [place]   :  REVIEW OF SYSTEM:  The rest of the 14-point review of sytem was negative.   Exam: ECOG 0.   General:  well-nourished man, in no acute distress.  Eyes:  no scleral icterus.  ENT:  There were no oropharyngeal lesions.  Neck was without thyromegaly.  Lymphatics:  Negative cervical, supraclavicular or axillary adenopathy.  Respiratory: lungs were clear bilaterally without wheezing or crackles.  Cardiovascular:  Regular rate and rhythm, S1/S2, without murmur, rub or gallop.  There was 1+ left lower extremity edema with venous status discoloration.  There was no palpable cords on either lower extremity.   GI:  abdomen was soft, flat, nontender, nondistended, without organomegaly.  Muscoloskeletal:  no spinal tenderness of palpation of vertebral spine.  Skin exam was without echymosis, petichae.  Neuro exam was nonfocal.  Patient was able to get on and off exam table without assistance.  Gait was normal.  Patient was alerted and oriented.  Attention was good.   Language was appropriate.  Mood was normal without depression.  Speech was not pressured.  Thought content was not tangential.    LABS:  Lab Results  Component Value Date   WBC 4.5 11/19/2012   HGB 12.4* 11/19/2012   HCT 37.3* 11/19/2012   PLT 248 11/19/2012   GLUCOSE 101* 11/19/2012   CHOL 140 09/09/2011   TRIG 73.0 09/09/2011   HDL 57.10 09/09/2011   LDLCALC 68 09/09/2011   ALT 13 11/18/2012   AST 18 11/18/2012   NA 140 11/19/2012   K 4.1 11/19/2012   CL 104 11/19/2012   CREATININE 1.33 11/19/2012   BUN 14 11/19/2012   CO2 28 11/19/2012   PSA 0.69 09/09/2011   INR 1.20* 12/02/2012   HGBA1C 6.0* 11/19/2012   MICROALBUR 0.7 09/09/2011    ASSESSMENT AND PLAN:   1.  Issue:  Recurrent blood clot; and recently with pulmonary embolism with large clot  burden. 2.  Cause:  Unknown.  Extensive hypercoagulable work up did not show obvious reason.  About 30% of patients with recurrent clots have no obvious explanation.  He is 46 year-olds and has no local symptoms to warrant pan CT.  At least his CT chest did not show lung CT.  His abdominal US did not show concerning findings.  I advised him to obtain screening colonoscopy when he turns 51.  3.  Duration of anticoagulation:  Life long due to recurrent, nonprovoked bilateral PE with right heart strain.  We can readdress this issue again in the future if he develops bleeding  complication due to chronic anticoagulation.  4.  Choice blood thinner:  Either Coumadin vs. Xarelto.    *  Coumadin:  Cheap, and presence of antidotes.  Downsides:  Have to check INR on a routine basis; effect of diet and other drugs.  *  Xarelto:  More expensive; slightly lower risk of bleeding.  Downsides:  No antidotes, expensive.   He expressed informed understanding and would like to continue Xarelto indefinitely at this time.   5.  Follow up: with Dr. Tawanna Cooler.  No further work up is indicated.       The length of time of the face-to-face encounter was 40 minutes. More than 50% of time was spent counseling and coordination of care.     Thank you for this referral.

## 2012-12-08 ENCOUNTER — Ambulatory Visit (INDEPENDENT_AMBULATORY_CARE_PROVIDER_SITE_OTHER): Payer: BC Managed Care – PPO | Admitting: Family Medicine

## 2012-12-08 ENCOUNTER — Encounter: Payer: Self-pay | Admitting: Family Medicine

## 2012-12-08 VITALS — BP 140/90 | Temp 99.0°F | Wt 229.0 lb

## 2012-12-08 DIAGNOSIS — I2699 Other pulmonary embolism without acute cor pulmonale: Secondary | ICD-10-CM

## 2012-12-08 DIAGNOSIS — N281 Cyst of kidney, acquired: Secondary | ICD-10-CM

## 2012-12-08 DIAGNOSIS — Q619 Cystic kidney disease, unspecified: Secondary | ICD-10-CM

## 2012-12-08 DIAGNOSIS — Z86718 Personal history of other venous thrombosis and embolism: Secondary | ICD-10-CM

## 2012-12-08 MED ORDER — RIVAROXABAN 20 MG PO TABS: 20.0000 mg | ORAL_TABLET | Freq: Every day | ORAL | Status: DC

## 2012-12-08 NOTE — Progress Notes (Signed)
  Subjective:    Patient ID: Joshua Castaneda, male    DOB: 05-13-1966, 47 y.o.   MRN: [id number]  HPI Joshua Castaneda is a 47 year old married male nonsmoker who comes in today following a hospital stay for multiple pulmonary emboli  We saw him in the office on January 28 for evaluation of shortness of breath with exertion. At that juncture he complained of shortness of breath with exertion when he got to about 30 minutes on his treadmill. He typically exercises 60-80 minutes a day. He would have to slow down a little bit but did not have to stop and that not having chest pain. The symptoms have been going on for about 2 weeks. 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 EMS was called and he was taken the hospital. A lung scan was done which showed multiple pulmonary emboli. He was started on heparin and did well and had no complications. He was discharged on an oral anticoagulant 15 mg twice a day and starting on February 20 to take 20 mg daily forever  We discussed this versus Coumadin. He wishes to stay and his current therapy.  Hematologic evaluation showed no evidence of coagulopathy  During his hospital stay he was noted to have a lesion on the superior pole the right kidney that was previously noted in 2008 they recommended a followup ultrasound   Review of Systems Review of systems otherwise negative    Objective:   Physical Exam Well-developed and nourished male no acute distress       Assessment & Plan:  Mass of bilateral pulmonary emboli etiology unknown continue anticoagulation for life  Cystic lesion right kidney followup ultrasound

## 2012-12-08 NOTE — Patient Instructions (Signed)
Take the oral anticoagulant one tablet daily for ever  Followup ultrasound of the lesion in your right kidney  Return in 6 weeks for followup

## 2012-12-10 ENCOUNTER — Ambulatory Visit: Payer: BC Managed Care – PPO | Admitting: Family Medicine

## 2012-12-11 ENCOUNTER — Ambulatory Visit
Admission: RE | Admit: 2012-12-11 | Discharge: 2012-12-11 | Disposition: A | Discharge status: Home / Self Care | Payer: BC Managed Care – PPO | Source: Ambulatory Visit | Attending: Family Medicine | Admitting: Family Medicine

## 2013-01-19 ENCOUNTER — Ambulatory Visit: Payer: BC Managed Care – PPO | Admitting: Family Medicine

## 2013-01-21 ENCOUNTER — Telehealth: Payer: Self-pay | Admitting: *Deleted

## 2013-01-21 NOTE — Telephone Encounter (Signed)
Pt called to ask if he needs to hold his Xarelto prior to having his teeth cleaned tomorrow.  Instructed pt he does not need to hold xarelto for routine cleaning,  But to let his Dentist know he is taking it and if any invasive procedures planned,  Then to contact Dr. Tawanna Cooler for further instructions since he is managing his Xarelto.  He verbalized understanding.

## 2013-02-04 ENCOUNTER — Encounter: Payer: Self-pay | Admitting: Family Medicine

## 2013-02-04 ENCOUNTER — Ambulatory Visit (INDEPENDENT_AMBULATORY_CARE_PROVIDER_SITE_OTHER): Payer: BC Managed Care – PPO | Admitting: Family Medicine

## 2013-02-04 VITALS — BP 140/90 | Temp 98.4°F | Wt 238.0 lb

## 2013-02-04 DIAGNOSIS — I2699 Other pulmonary embolism without acute cor pulmonale: Secondary | ICD-10-CM

## 2013-02-04 MED ORDER — RIVAROXABAN 15 MG PO TABS: 15.0000 mg | ORAL_TABLET | Freq: Every day | ORAL | Status: DC

## 2013-02-04 NOTE — Patient Instructions (Signed)
Take the medication one daily  Call in December and make an appointment to see the hematologist in January to discuss ongoing medication versus switching to plain aspirin

## 2013-02-04 NOTE — Progress Notes (Signed)
  Subjective:    Patient ID: Joshua Castaneda, male    DOB: 06-Nov-1965, 47 y.o.   MRN: [id number]  HPI Joshua Castaneda is a 47 year old male married nonsmoker who comes in today for followup of a DVT with multiple pulmonary emboli  We saw him in January for an office visit because of shortness of breath. He had no other symptoms. Evaluation subsequently showed DVT unprovoked no history of trauma with multiple pulmonary emboli. He was admitted for IV heparin and then switched to an oral medication.......xarelto . He's done well and has no complications. He was seen also in followup in hematology. No etiology could be found the triggered the blood clots   Review of Systems    review of systems negative Objective:   Physical Exam  Well-developed well-nourished male in no acute distress renal ultrasound shows a cystic lesion.      Assessment & Plan:  Unprovoked DVT with multiple pulmonary emboli continue oral blood thinner followup with hematology in December

## 2013-10-26 ENCOUNTER — Ambulatory Visit (INDEPENDENT_AMBULATORY_CARE_PROVIDER_SITE_OTHER): Payer: BC Managed Care – PPO | Admitting: Family Medicine

## 2013-10-26 ENCOUNTER — Encounter: Payer: Self-pay | Admitting: Family Medicine

## 2013-10-26 VITALS — BP 150/90 | Temp 97.9°F | Wt 255.0 lb

## 2013-10-26 DIAGNOSIS — M795 Residual foreign body in soft tissue: Secondary | ICD-10-CM

## 2013-10-26 NOTE — Progress Notes (Signed)
   Subjective:    Patient ID: Joshua Castaneda, male    DOB: 1965-12-23, 48 y.o.   MRN: [id number]  HPI Joshua Castaneda is a 48 year old male who comes in today because of a fishbone in his throat  He states he was eating fish this past Saturday 3 days ago when he noticed a fishbone stuck in his left tonsillar. He thinks a fishbone is still her   Review of Systems Negative    Objective:   Physical Exam  Well-developed well-nourished male no acute distress okay to show some erythema the left tonsillar crypt no foreign body      Assessment & Plan:  Foreign body,,,,,,,,,,,,,,,, fishbone,,,,,,,,,, left tonsillar crypt resolve spontaneously

## 2013-10-26 NOTE — Patient Instructions (Signed)
Gargle with warm saltwater 3-4 times daily return.

## 2013-10-26 NOTE — Progress Notes (Signed)
Pre visit review using our clinic review tool, if applicable. No additional management support is needed unless otherwise documented below in the visit note. 

## 2013-12-31 ENCOUNTER — Other Ambulatory Visit: Payer: Self-pay | Admitting: Family Medicine

## 2013-12-31 NOTE — Telephone Encounter (Signed)
Pt needs refill on xarelto 15 mg

## 2014-01-26 IMAGING — CT CT ANGIO CHEST
2 of 7 series · 18 of 46 positions shown · IV contrast (APPLIED)
Comparison: Prior CT abdomen/pelvis 01/28/2007.

CLINICAL DATA: Rule out pulmonary embolism, history of prior left
leg DVT and 0772

CT ANGIOGRAPHY CHEST
TECHNIQUE: Multidetector CT imaging of the chest using the
standard protocol during bolus administration of intravenous
contrast. Multiplanar reconstructed images including MIPs were
obtained and reviewed to evaluate the vascular anatomy.
Contrast: 100mL OMNIPAQUE IOHEXOL 350 MG/ML SOLN

[Series 7: pulm embolism 1.0 b25f thin · axial · 0.71mm/px · z∈[-308,-21]mm · 15 of 317 slices shown]
[im 15/317  lung]
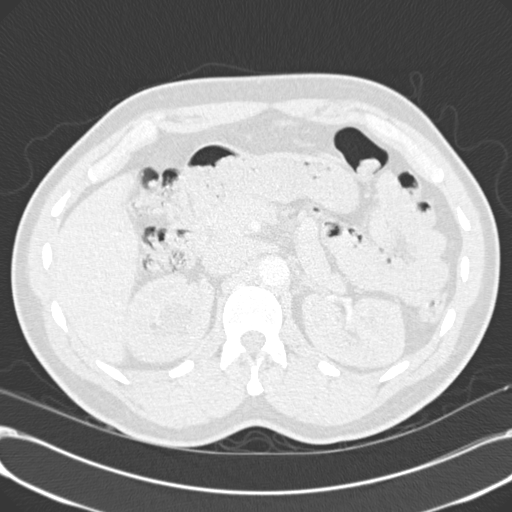
[im 44/317  soft-tissue]
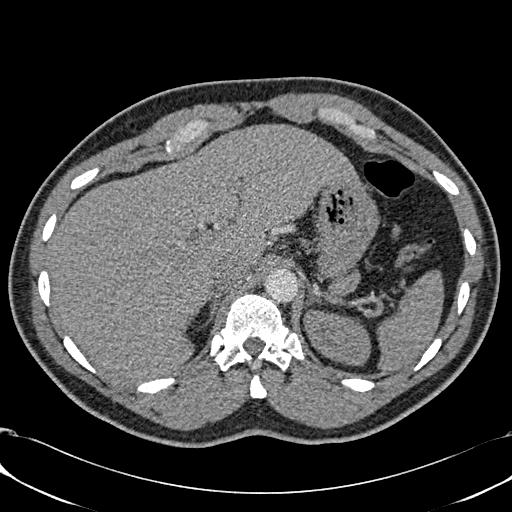
[im 58/317  lung]
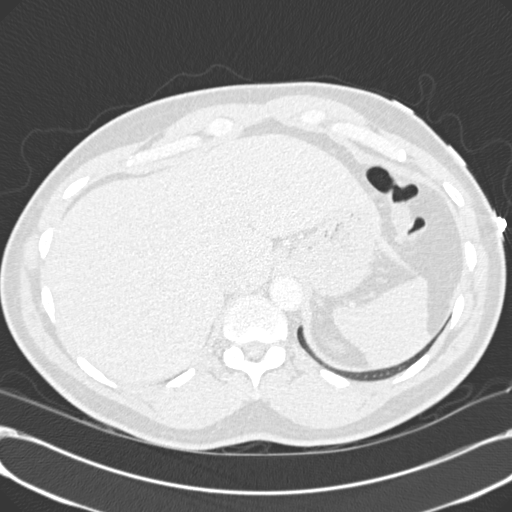
[im 72/317  soft-tissue]
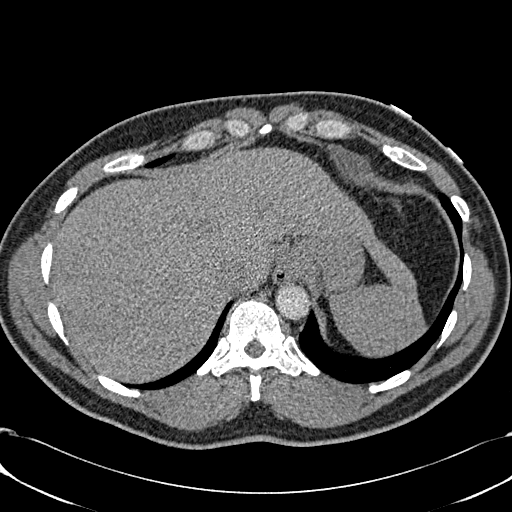
[im 101/317  lung]
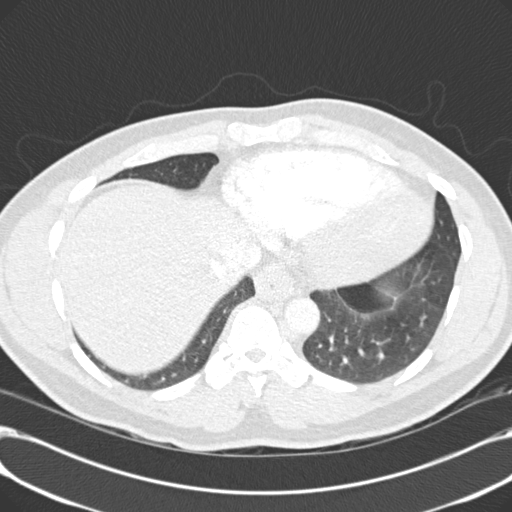
[im 115/317  soft-tissue]
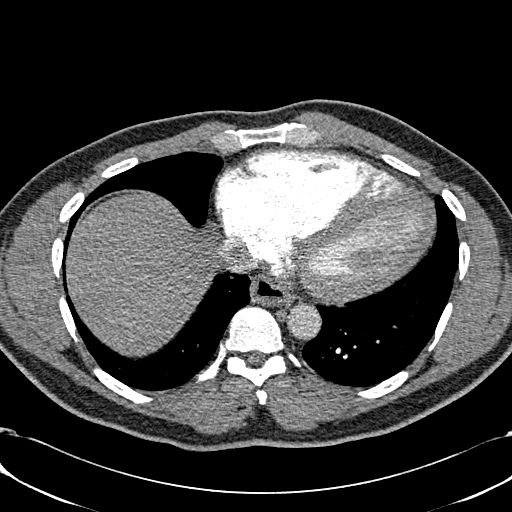
[im 144/317  lung]
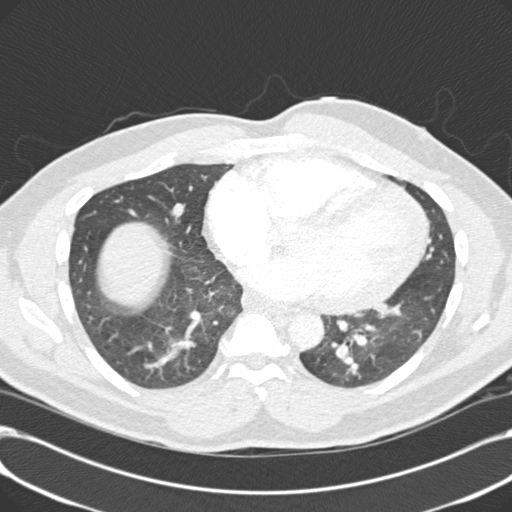
[im 159/317  soft-tissue]
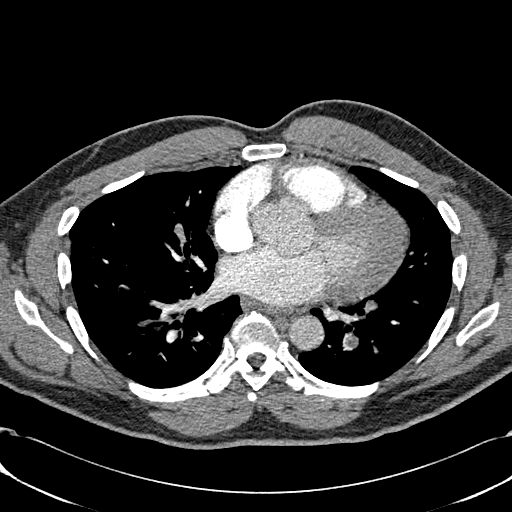
[im 173/317  lung]
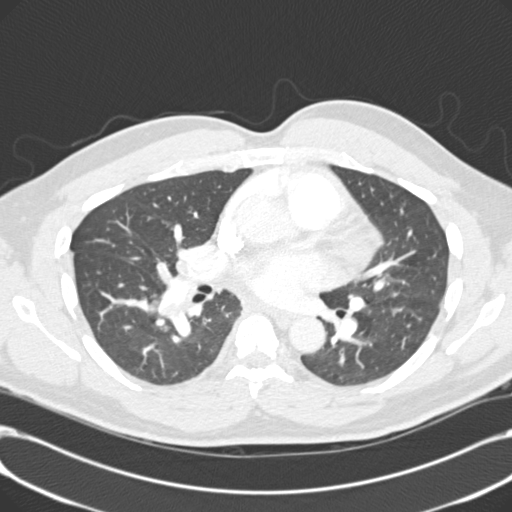
[im 202/317  soft-tissue]
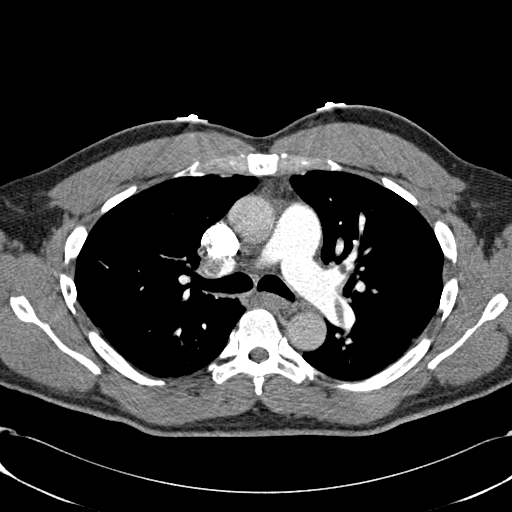
[im 216/317  lung]
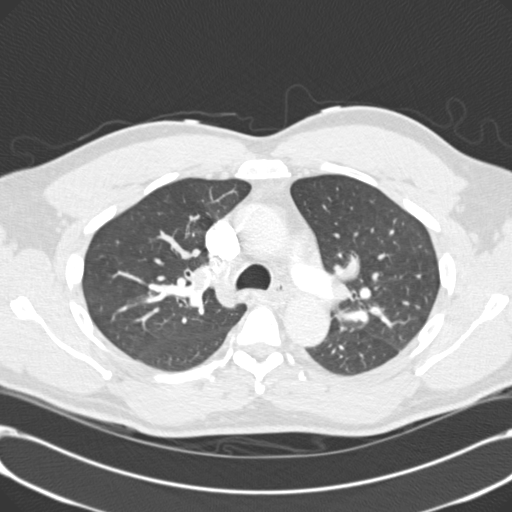
[im 245/317  soft-tissue]
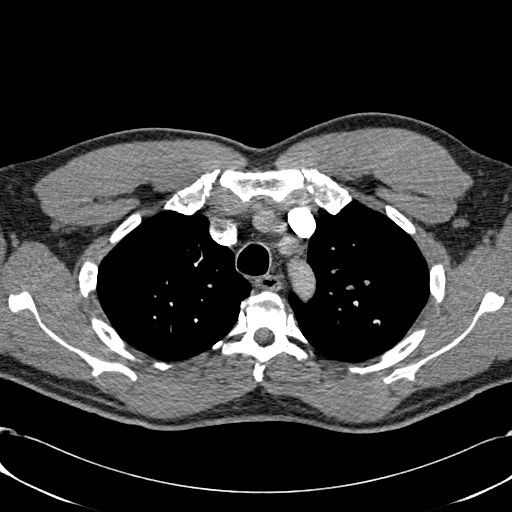
[im 259/317  lung]
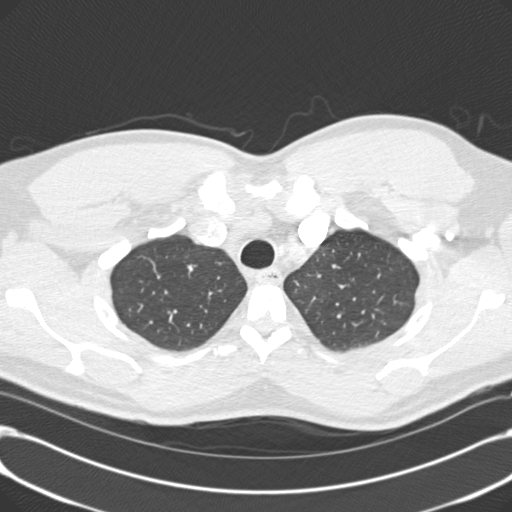
[im 273/317  soft-tissue]
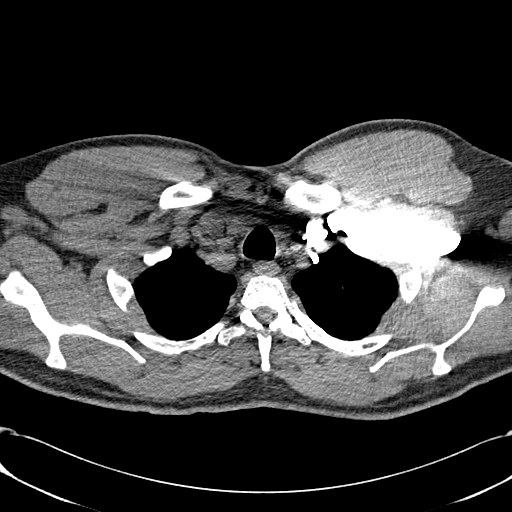
[im 302/317  lung]
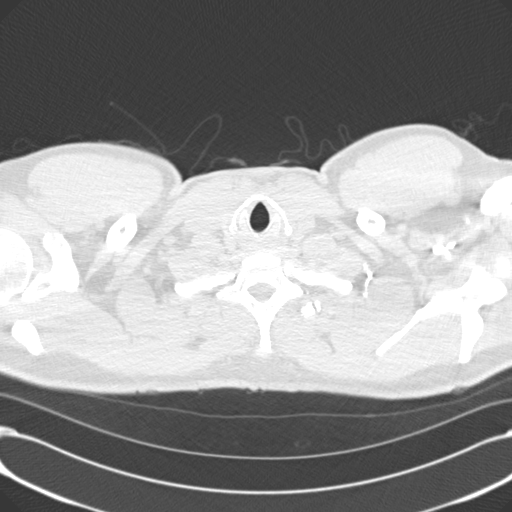

[Series 9: coronals · coronal · 0.74mm/px · 3 of 107 slices shown]
[im 27/107  soft-tissue]
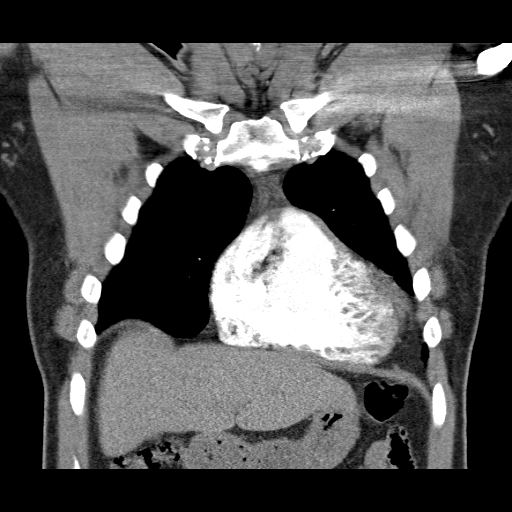
[im 54/107  soft-tissue]
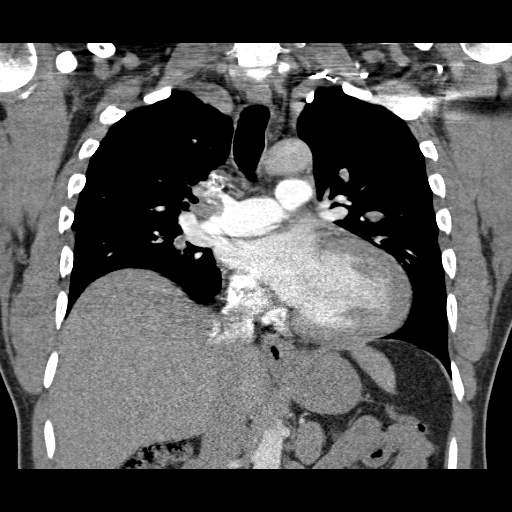
[im 80/107  soft-tissue]
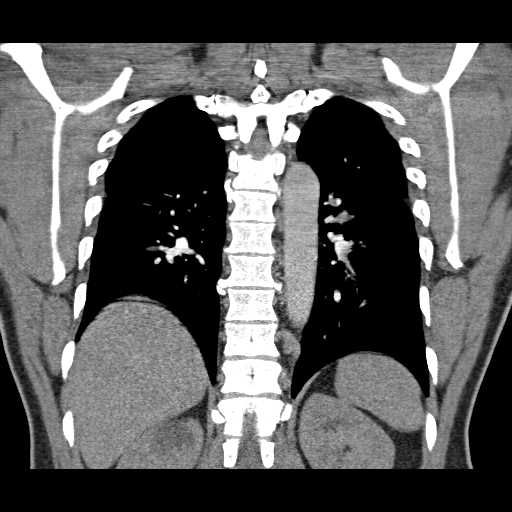

[18 of 46 positions shown; findings below may reference images not displayed]

FINDINGS: Mediastinum: Unremarkable CT appearance of the thyroid gland.  No
suspicious mediastinal or hilar adenopathy.  Small hiatal hernia.

Heart/Vascular: Excellent opacification of the pulmonary arteries
to the subsegmental level.  There are central filling defects
within the bilateral main pulmonary arteries extending into lobar,
segmental and subsegmental pulmonary arteries supplying the right
upper, middle and lower lobe as well as in the left upper and lower
lobes.  Overall embolic burden greater on the right than the left.
There may be mild bowing of the interatrial septum with a reflux of
contrast material into the suprahepatic IVC.  No pericardial
effusion. Mild cardiomegaly with right atrial enlargement.

Lungs/Pleura: The lungs are clear.  No focal consolidation, pleural
effusion or suspicious pulmonary nodule.  There is an isolated
blood versus pulmonary cysts in the medial left lower lobe.

Upper Abdomen: A low attenuation cystic lesion in the upper pole of
the right kidney measures up to 3.2 cm in diameter which is
enlarged compared to 2.3 cm in diameter on the prior study from
9335.  The lesion is incompletely characterized on the present
study

Bones: No acute fracture or aggressive appearing lytic or blastic
osseous lesion.
IMPRESSION: 1.  CT findings are consistent with sub massive pulmonary embolism.
Large volume bilateral pulmonary emboli involving all lobes of both
lungs.  Additionally, there is evidence of right heart strain with
right atrial dilatation, bowing of the interatrial septum and
reflux of contrast into the suprahepatic IVC.

2.  Enlarging cystic lesion in the upper pole right kidney compared
to 9335 is incompletely characterized on the present study.  While
this most likely represents a simple cyst, this could be confirmed
with a non emergent renal ultrasound when the patient is clinically
stable.

Critical Value/emergent results were called by telephone at the
time of interpretation on 11/16/2012 at [DATE] p.m. to Dr. Costilla
Tiger, who verbally acknowledged these results.

## 2014-01-27 IMAGING — US US RENAL
1 series · 13 of 25 positions shown · non-contrast
Comparison: Chest CT on 11/17/2012 and abdominal CT on 01/28/2007.

CLINICAL DATA: Cystic area of the upper right kidney demonstrating
enlargement since 4000 by CT.  The

RENAL/URINARY TRACT ULTRASOUND COMPLETE

[Series 1: us renal · 0.26mm/px · 13 of 46 slices shown]
[im 1/46]
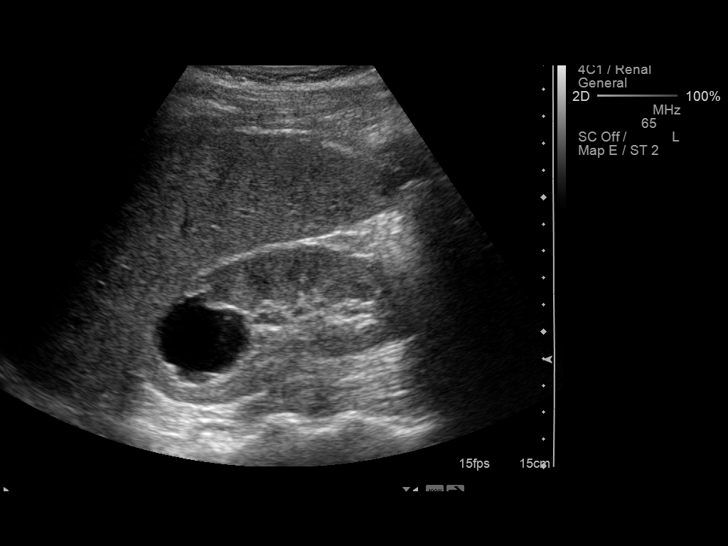
[im 4/46]
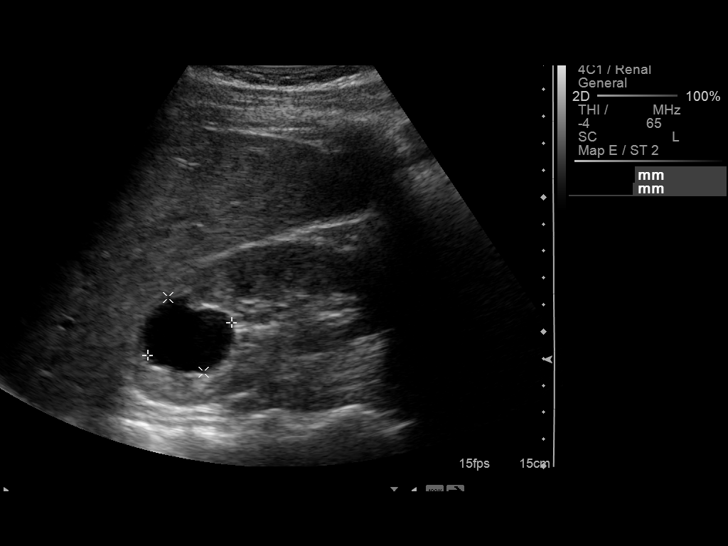
[im 8/46]
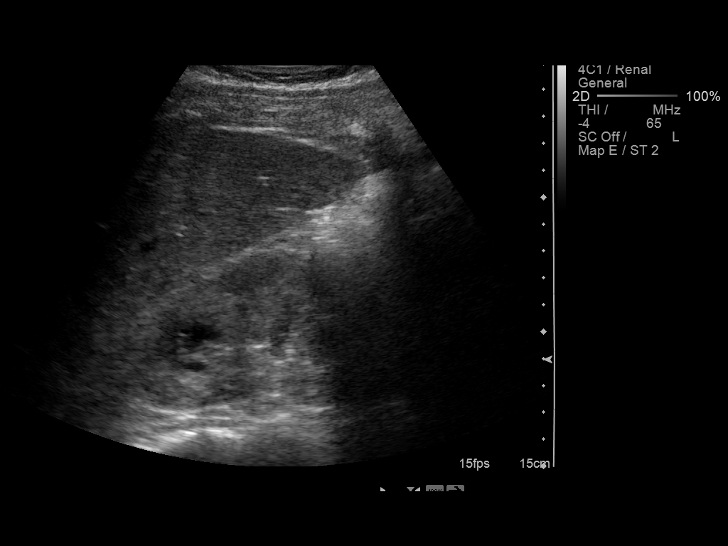
[im 12/46]
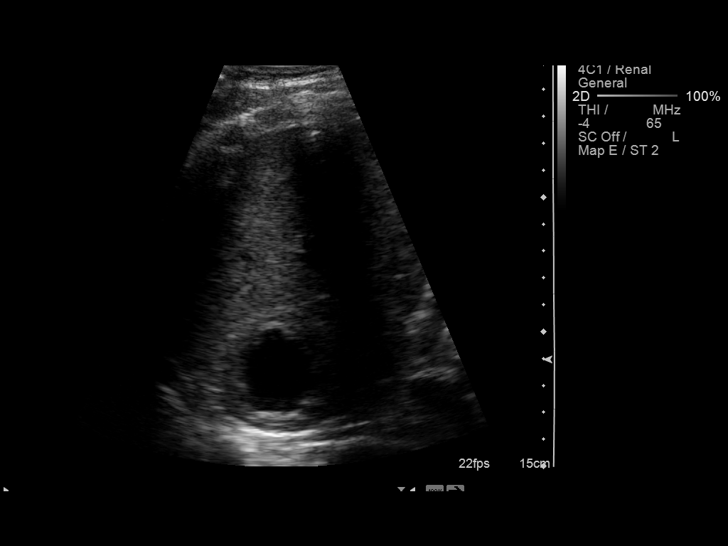
[im 16/46]
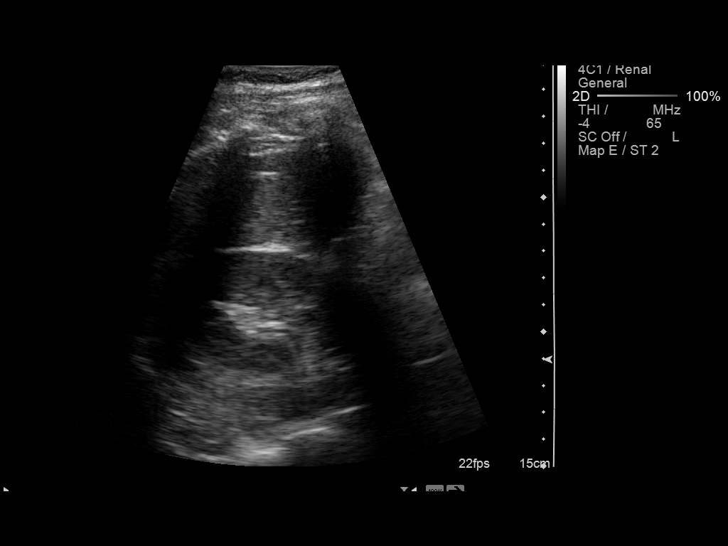
[im 19/46]
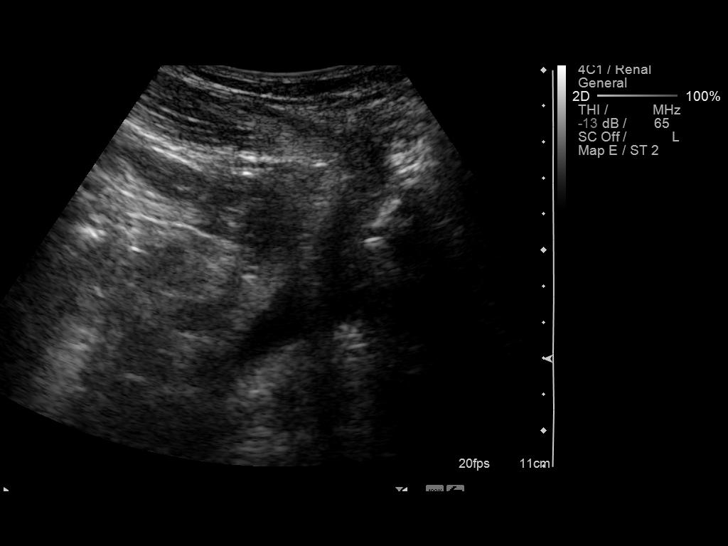
[im 23/46]
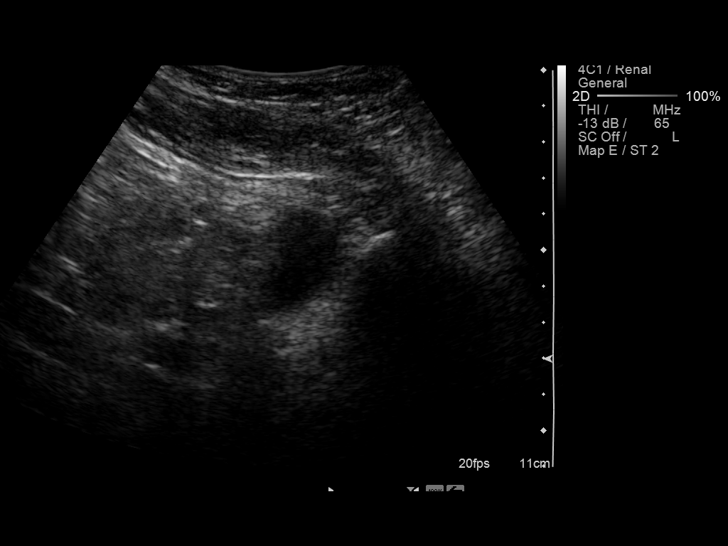
[im 27/46]
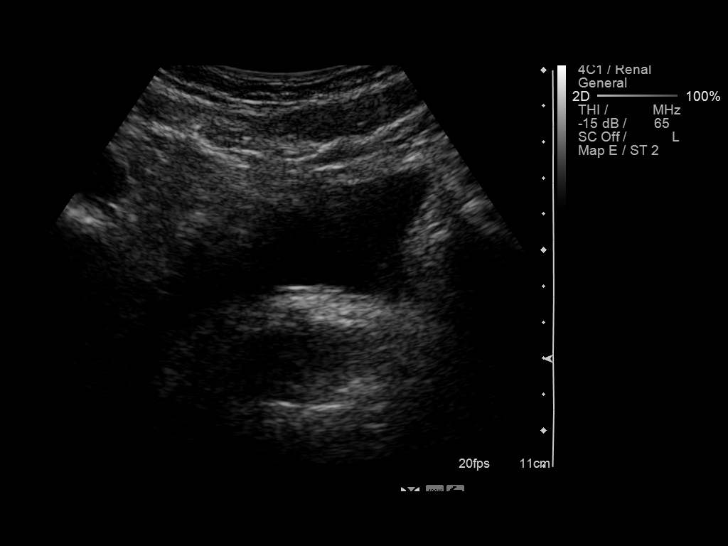
[im 31/46]
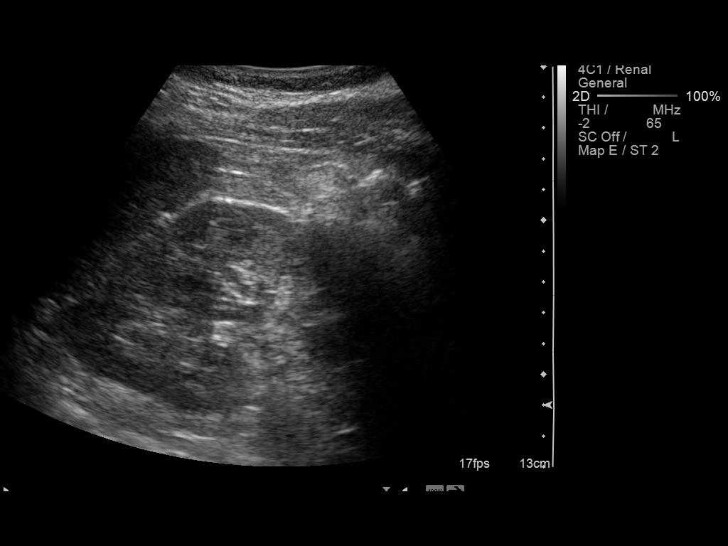
[im 34/46]
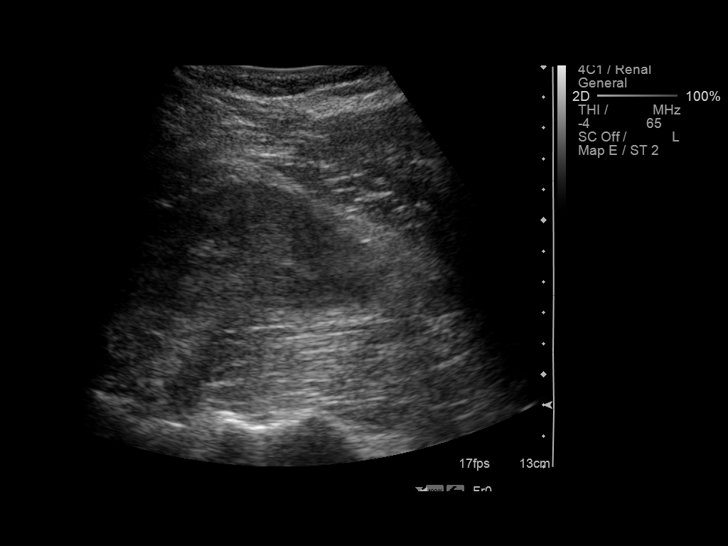
[im 38/46]
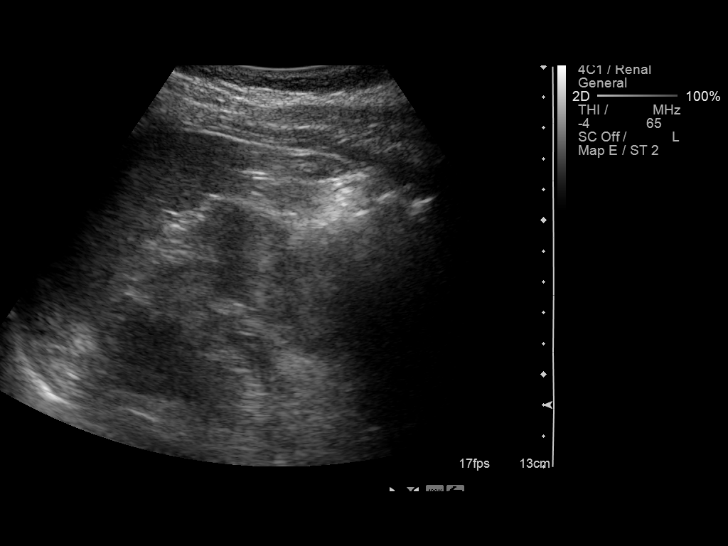
[im 42/46]
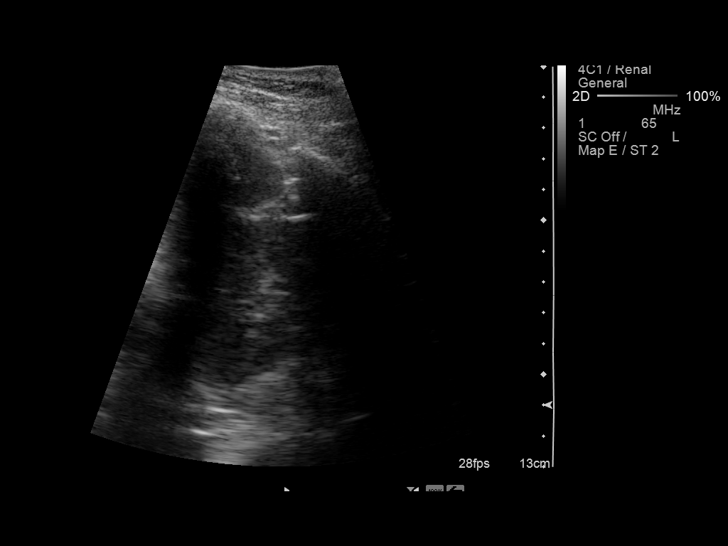
[im 46/46]
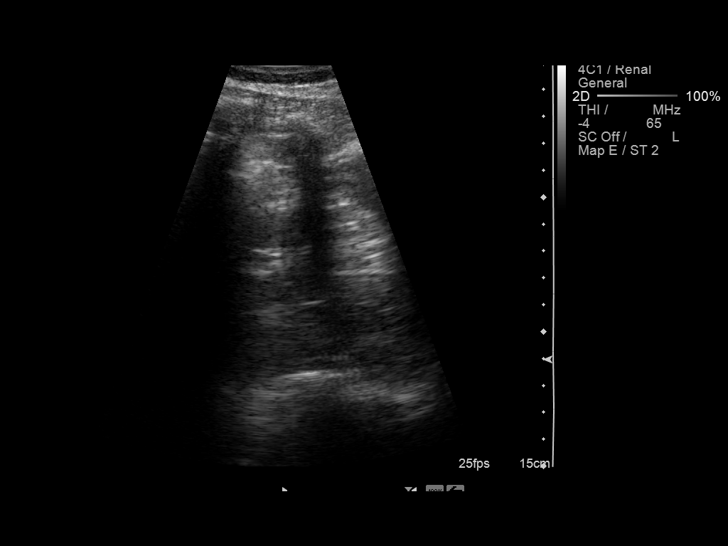

[13 of 25 positions shown; findings below may reference images not displayed]

FINDINGS: Right Kidney:  The right kidney measures approximately 10.3 cm in
length. The renal cortex is felt to be mildly echogenic without
evidence of cortical atrophy.  This may be reflective of some
degree of underlying chronic kidney disease.  A well delineated
simple cyst of the upper pole measures approximately 3.4 x 3.1 x
2.9 cm.  No other right-sided renal lesions are identified.  There
is no evidence of hydronephrosis.

Left Kidney:  The left kidney has a normal sonographic appearance
and measures approximately 10.1 cm.  Similar to the right side, the
renal cortex is mildly and diffusely echogenic.  No significant
atrophy identified.  No evidence of left renal lesion or
hydronephrosis.

Bladder:  The bladder is decompressed.
IMPRESSION: 1.  The upper pole cyst of the right kidney measures 3.4 cm in
greatest diameter by ultrasound and has a simple and benign
appearance.
2.  Both kidneys show suggestion of increased cortical echogenicity
which may reflect a degree of underlying chronic kidney disease.
There is no evidence of significant cortical atrophy or renal
obstruction bilaterally.

## 2014-05-30 ENCOUNTER — Ambulatory Visit (INDEPENDENT_AMBULATORY_CARE_PROVIDER_SITE_OTHER): Payer: BC Managed Care – PPO | Admitting: Family Medicine

## 2014-05-30 ENCOUNTER — Encounter: Payer: Self-pay | Admitting: Family Medicine

## 2014-05-30 VITALS — BP 142/80 | HR 84 | Temp 98.1°F | Wt 251.0 lb

## 2014-05-30 DIAGNOSIS — Z0000 Encounter for general adult medical examination without abnormal findings: Secondary | ICD-10-CM

## 2014-05-30 DIAGNOSIS — I2699 Other pulmonary embolism without acute cor pulmonale: Secondary | ICD-10-CM

## 2014-05-30 DIAGNOSIS — I10 Essential (primary) hypertension: Secondary | ICD-10-CM

## 2014-05-30 DIAGNOSIS — H8110 Benign paroxysmal vertigo, unspecified ear: Secondary | ICD-10-CM

## 2014-05-30 DIAGNOSIS — E119 Type 2 diabetes mellitus without complications: Secondary | ICD-10-CM

## 2014-05-30 DIAGNOSIS — H8113 Benign paroxysmal vertigo, bilateral: Secondary | ICD-10-CM

## 2014-05-30 MED ORDER — RIVAROXABAN 15 MG PO TABS: 15.0000 mg | ORAL_TABLET | Freq: Every day | ORAL | Status: DC

## 2014-05-30 NOTE — Progress Notes (Signed)
   Subjective:    Patient ID: Joshua Castaneda, male    DOB: 10-Mar-1966, 48 y.o.   MRN: [id number]  HPI Joshua Castaneda is a 48 year old married male nonsmoker who comes in today for evaluation of vertigo  He states on Saturday morning one in up to get to work he notices slight dizziness. Felt fine the rest a day Sunday morning was more severe he stated bed rest all day Sunday today the vertigo is gone.  He's never had vertigo in the past  No history of trauma hearing loss or any neurologic symptoms.  He has a history of hypertension diabetes which he is ameliorated with diet exercise and weight loss. He's currently on no medications except xeralto because of a pulmonary embolus. He says they told him he ought to take it for over   Review of Systems Review of systems otherwise negative last physical 3 years ago    Objective:   Physical Exam  Well-developed well-nourished male no acute distress vital signs stable he is afebrile HEENT negative neck was supple  Neurologic exam cranial nerves 2-12 intact cerebellar muscle strength all within normal limits. Gait normal no drift      Assessment & Plan:  Benign positional vertigo resolved with bedrestm.......Marland Kitchen. reassured

## 2014-05-30 NOTE — Progress Notes (Signed)
Pre visit review using our clinic review tool, if applicable. No additional management support is needed unless otherwise documented below in the visit note. 

## 2014-05-30 NOTE — Patient Instructions (Signed)
Continue good diet and exercise program  Set up a time in the next 30 days for a 30 minute appointment for general physical examination.......... fasting labs one week prior

## 2014-05-31 ENCOUNTER — Telehealth: Payer: Self-pay | Admitting: Family Medicine

## 2014-05-31 NOTE — Telephone Encounter (Signed)
Relevant patient education assigned to patient using Emmi. ° °

## 2014-07-07 ENCOUNTER — Other Ambulatory Visit: Payer: BC Managed Care – PPO

## 2014-07-08 ENCOUNTER — Other Ambulatory Visit (INDEPENDENT_AMBULATORY_CARE_PROVIDER_SITE_OTHER): Payer: BC Managed Care – PPO

## 2014-07-08 DIAGNOSIS — I10 Essential (primary) hypertension: Secondary | ICD-10-CM

## 2014-07-08 DIAGNOSIS — E785 Hyperlipidemia, unspecified: Secondary | ICD-10-CM

## 2014-07-08 DIAGNOSIS — E119 Type 2 diabetes mellitus without complications: Secondary | ICD-10-CM

## 2014-07-08 LAB — LIPID PANEL
CHOLESTEROL: 239 mg/dL — AB (ref 0–200)
HDL: 60 mg/dL (ref >39.00)
LDL CALC: 166 mg/dL — AB (ref 0–99)
NonHDL: 179
TRIGLYCERIDES: 65 mg/dL (ref 0.0–149.0)
Total CHOL/HDL Ratio: 4
VLDL: 13 mg/dL (ref 0.0–40.0)

## 2014-07-08 LAB — CBC WITH DIFFERENTIAL/PLATELET
Basophils Absolute: 0.1 10*3/uL (ref 0.0–0.1)
Basophils Relative: 1.1 % (ref 0.0–3.0)
EOS ABS: 0.3 10*3/uL (ref 0.0–0.7)
Eosinophils Relative: 7.2 % — ABNORMAL HIGH (ref 0.0–5.0)
HCT: 39.5 % (ref 39.0–52.0)
Hemoglobin: 13.1 g/dL (ref 13.0–17.0)
Lymphocytes Relative: 44.1 % (ref 12.0–46.0)
Lymphs Abs: 2.1 10*3/uL (ref 0.7–4.0)
MCHC: 33.1 g/dL (ref 30.0–36.0)
MCV: 85.2 fl (ref 78.0–100.0)
MONO ABS: 0.4 10*3/uL (ref 0.1–1.0)
Monocytes Relative: 9.2 % (ref 3.0–12.0)
NEUTROS PCT: 38.4 % — AB (ref 43.0–77.0)
Neutro Abs: 1.8 10*3/uL (ref 1.4–7.7)
PLATELETS: 322 10*3/uL (ref 150.0–400.0)
RBC: 4.64 Mil/uL (ref 4.22–5.81)
RDW: 15.1 % (ref 11.5–15.5)
WBC: 4.8 10*3/uL (ref 4.0–10.5)

## 2014-07-08 LAB — POCT URINALYSIS DIPSTICK
Bilirubin, UA: NEGATIVE
Blood, UA: 2
Glucose, UA: NEGATIVE
Ketones, UA: NEGATIVE
LEUKOCYTES UA: NEGATIVE
NITRITE UA: NEGATIVE
Protein, UA: NEGATIVE
Spec Grav, UA: 1.02
Urobilinogen, UA: 0.2
pH, UA: 5

## 2014-07-08 LAB — HEPATIC FUNCTION PANEL
ALT: 18 U/L (ref 0–53)
AST: 23 U/L (ref 0–37)
Albumin: 4.1 g/dL (ref 3.5–5.2)
Alkaline Phosphatase: 65 U/L (ref 39–117)
BILIRUBIN TOTAL: 1.3 mg/dL — AB (ref 0.2–1.2)
Bilirubin, Direct: 0.2 mg/dL (ref 0.0–0.3)
Total Protein: 7.4 g/dL (ref 6.0–8.3)

## 2014-07-08 LAB — BASIC METABOLIC PANEL
BUN: 18 mg/dL (ref 6–23)
CHLORIDE: 104 meq/L (ref 96–112)
CO2: 26 mEq/L (ref 19–32)
Calcium: 9.5 mg/dL (ref 8.4–10.5)
Creatinine, Ser: 1.5 mg/dL (ref 0.4–1.5)
GFR: 63.38 mL/min (ref >60.00)
Glucose, Bld: 93 mg/dL (ref 70–99)
POTASSIUM: 4.1 meq/L (ref 3.5–5.1)
SODIUM: 138 meq/L (ref 135–145)

## 2014-07-08 LAB — MICROALBUMIN / CREATININE URINE RATIO
CREATININE, U: 322.1 mg/dL
MICROALB UR: 2.8 mg/dL — AB (ref 0.0–1.9)
Microalb Creat Ratio: 0.9 mg/g (ref 0.0–30.0)

## 2014-07-08 LAB — TSH: TSH: 1.07 u[IU]/mL (ref 0.35–4.50)

## 2014-07-08 LAB — HEMOGLOBIN A1C: HEMOGLOBIN A1C: 6.3 % (ref 4.6–6.5)

## 2014-07-08 LAB — PSA: PSA: 1.01 ng/mL (ref 0.10–4.00)

## 2014-07-13 ENCOUNTER — Ambulatory Visit (INDEPENDENT_AMBULATORY_CARE_PROVIDER_SITE_OTHER): Payer: BC Managed Care – PPO | Admitting: Family Medicine

## 2014-07-13 ENCOUNTER — Encounter: Payer: Self-pay | Admitting: Family Medicine

## 2014-07-13 VITALS — BP 130/88 | Temp 97.3°F | Ht 72.25 in | Wt 253.0 lb

## 2014-07-13 DIAGNOSIS — E785 Hyperlipidemia, unspecified: Secondary | ICD-10-CM

## 2014-07-13 DIAGNOSIS — I2699 Other pulmonary embolism without acute cor pulmonale: Secondary | ICD-10-CM

## 2014-07-13 MED ORDER — RIVAROXABAN 20 MG PO TABS: ORAL_TABLET | ORAL | Status: DC

## 2014-07-13 NOTE — Patient Instructions (Signed)
Continue the blood thinning medication daily  Followup in 1 year sooner if any problems

## 2014-07-13 NOTE — Progress Notes (Signed)
   Subjective:    Patient ID: Joshua Castaneda, male    DOB: August 12, 1966, 48 y.o.   MRN: [id number]  HPI Joshua Castaneda is a 48 year old married male nonsmoker who comes in for general physical examination  He had 2 episodes of DVT the second episode involved a pulmonary embolus. We sent him to hematology. Workup was nondiagnostic. However they recommended a blood thinner daily. He's on the new medication xarelto 20 mg daily. We discussed the pluses and minuses of either this or Coumadin. He elects to stay with this.  He gets routine eye care, dental care, colonoscopy weight 250 since family history negative for colon cancer polyp  He's married lives in [place] works for [employer]   Review of Systems  Constitutional: Negative.   HENT: Negative.   Eyes: Negative.   Respiratory: Negative.   Cardiovascular: Negative.   Gastrointestinal: Negative.   Genitourinary: Negative.   Musculoskeletal: Negative.   Skin: Negative.   Neurological: Negative.   Psychiatric/Behavioral: Negative.        Objective:   Physical Exam  Nursing note and vitals reviewed. Constitutional: He is oriented to person, place, and time. He appears well-developed and well-nourished.  HENT:  Head: Normocephalic and atraumatic.  Right Ear: External ear normal.  Left Ear: External ear normal.  Nose: Nose normal.  Mouth/Throat: Oropharynx is clear and moist.  Eyes: Conjunctivae and EOM are normal. Pupils are equal, round, and reactive to light.  Neck: Normal range of motion. Neck supple. No JVD present. No tracheal deviation present. No thyromegaly present.  Cardiovascular: Normal rate, regular rhythm, normal heart sounds and intact distal pulses.  Exam reveals no gallop and no friction rub.   No murmur heard. Pulmonary/Chest: Effort normal and breath sounds normal. No stridor. No respiratory distress. He has no wheezes. He has no rales. He exhibits no tenderness.  Abdominal: Soft. Bowel sounds are normal. He  exhibits no distension and no mass. There is no tenderness. There is no rebound and no guarding.  Genitourinary: Rectum normal, prostate normal and penis normal. Guaiac negative stool. No penile tenderness.  Musculoskeletal: Normal range of motion. He exhibits no edema and no tenderness.  Lymphadenopathy:    He has no cervical adenopathy.  Neurological: He is alert and oriented to person, place, and time. He has normal reflexes. No cranial nerve deficit. He exhibits normal muscle tone.  Skin: Skin is warm and dry. No rash noted. No erythema. No pallor.  Psychiatric: He has a normal mood and affect. His behavior is normal. Judgment and thought content normal.          Assessment & Plan:  Healthy male  History of DVT with PE,,,,,,,, chronic anticoagulation therapy for life

## 2014-07-13 NOTE — Progress Notes (Signed)
Pre visit review using our clinic review tool, if applicable. No additional management support is needed unless otherwise documented below in the visit note. 

## 2015-07-26 ENCOUNTER — Other Ambulatory Visit: Payer: Self-pay | Admitting: Family Medicine

## 2015-11-02 ENCOUNTER — Ambulatory Visit (INDEPENDENT_AMBULATORY_CARE_PROVIDER_SITE_OTHER): Payer: Managed Care, Other (non HMO) | Admitting: Family Medicine

## 2015-11-02 ENCOUNTER — Encounter: Payer: Self-pay | Admitting: Family Medicine

## 2015-11-02 VITALS — BP 126/90 | HR 86 | Temp 98.7°F | Ht 72.25 in | Wt 248.5 lb

## 2015-11-02 DIAGNOSIS — H9202 Otalgia, left ear: Secondary | ICD-10-CM

## 2015-11-02 DIAGNOSIS — IMO0001 Reserved for inherently not codable concepts without codable children: Secondary | ICD-10-CM

## 2015-11-02 DIAGNOSIS — R030 Elevated blood-pressure reading, without diagnosis of hypertension: Secondary | ICD-10-CM

## 2015-11-02 DIAGNOSIS — J069 Acute upper respiratory infection, unspecified: Secondary | ICD-10-CM | POA: Diagnosis not present

## 2015-11-02 DIAGNOSIS — J3489 Other specified disorders of nose and nasal sinuses: Secondary | ICD-10-CM | POA: Diagnosis not present

## 2015-11-02 NOTE — Progress Notes (Signed)
Pre visit review using our clinic review tool, if applicable. No additional management support is needed unless otherwise documented below in the visit note. 

## 2015-11-02 NOTE — Patient Instructions (Addendum)
BEFORE YOU LEAVE: -schedule follow up with Dr. Tawanna Coolerodd in 2-3 weeks for blood pressure recheck and nasal lesion  Apply small amount of antibiotic ointment to lesion in Left nostril twice daily  Nasal saline  See your dentist as planned

## 2015-11-02 NOTE — Progress Notes (Signed)
HPI:  Acute visit for:  Ear pain: -started 3-4 days ago -symptoms, nasal congestion, occ blood in nasal mucus, L ear popping a few times and pain, L lower molar pain (seeing his dentist today for this) -denies: fevers, chills, SOB, DOE, sinus pain -uses flonase occasionally  Elevated BP: -reports elevated frequently at OV in the past and better on recheck -reports strong FH HTN  ROS: See pertinent positives and negatives per HPI.  Past Medical History  Diagnosis Date  . DVT (deep venous thrombosis) (HCC)     had LLE dvt in 1997; no clear provocation.   . Metabolic syndrome   . Hematuria     neg work up with Urology.   . Hypertension     No past surgical history on file.  Family History  Problem Relation Age of Onset  . Heart failure Father   . Stroke Mother   . Aneurysm Mother   . Hypertension Sister   . Hypertension Brother     Social History   Social History  . Marital Status: Single    Spouse Name: N/A  . Number of Children: 1  . Years of Education: N/A   Occupational History  .  [employer]    active the field.    Social History Main Topics  . Smoking status: Never Smoker   . Smokeless tobacco: Never Used  . Alcohol Use: No  . Drug Use: No  . Sexual Activity: Not Asked   Other Topics Concern  . None   Social History Narrative   wortks with [employer]   [place]  Has wife and kids liv [place]      Current outpatient prescriptions:  .  XARELTO 20 MG TABS tablet, take 1 tablet by mouth once daily, Disp: 100 tablet, Rfl: 0  EXAM:  Filed Vitals:   11/02/15 1100  BP: 126/90  Pulse: 86  Temp: 98.7 F (37.1 C)    Body mass index is 33.48 kg/(m^2).  GENERAL: vitals reviewed and listed above, alert, oriented, appears well hydrated and in no acute distress  HEENT: atraumatic, conjunttiva clear, no obvious abnormalities on inspection of external nose and ears, normal appearance of ear canals and TMs,  clear nasal congestion, small area of dried blood L inf turbinate, mild post oropharyngeal erythema with PND, no tonsillar edema or exudate, no sinus TTP, no gum swelling or redness appreciated  NECK: no obvious masses on inspection  LUNGS: clear to auscultation bilaterally, no wheezes, rales or rhonchi, good air movement  CV: HRRR, no peripheral edema  MS: moves all extremities without noticeable abnormality  PSYCH: pleasant and cooperative, no obvious depression or anxiety  ASSESSMENT AND PLAN:  Discussed the following assessment and plan:  Ear pain, left Acute upper respiratory infection -likely VURI with mild ETD, discussed OTC options and supportive care along with risks and return precations  Internal nasal lesion -abx ointment, recheck with PCP in 2 weeks  Elevated Blood Pressure: -improved on recheck, he reports chronic  -follow up with PCP in 2-3 weeks to recheck  -Patient advised to return or notify a doctor immediately if symptoms worsen or persist or new concerns arise.  Patient Instructions  BEFORE YOU LEAVE: -schedule follow up with Dr. Tawanna Coolerodd in 2-3 weeks for blood pressure recheck and nasal lesion  Apply small amount of antibiotic ointment to lesion in Left nostril twice daily  Nasal saline  See your dentist as planned     KIM,  HANNAH R.

## 2015-11-15 ENCOUNTER — Encounter: Payer: Self-pay | Admitting: Family Medicine

## 2015-11-15 ENCOUNTER — Ambulatory Visit (INDEPENDENT_AMBULATORY_CARE_PROVIDER_SITE_OTHER): Payer: Managed Care, Other (non HMO) | Admitting: Family Medicine

## 2015-11-15 ENCOUNTER — Encounter: Payer: Self-pay | Admitting: *Deleted

## 2015-11-15 VITALS — Temp 98.8°F | Wt 249.0 lb

## 2015-11-15 DIAGNOSIS — I10 Essential (primary) hypertension: Secondary | ICD-10-CM

## 2015-11-15 LAB — BASIC METABOLIC PANEL
BUN: 15 mg/dL (ref 6–23)
CO2: 29 mEq/L (ref 19–32)
Calcium: 9.3 mg/dL (ref 8.4–10.5)
Chloride: 103 mEq/L (ref 96–112)
Creatinine, Ser: 1.36 mg/dL (ref 0.40–1.50)
GFR: 71.65 mL/min (ref >60.00)
Glucose, Bld: 110 mg/dL — ABNORMAL HIGH (ref 70–99)
Potassium: 4.3 mEq/L (ref 3.5–5.1)
Sodium: 139 mEq/L (ref 135–145)

## 2015-11-15 LAB — CBC WITH DIFFERENTIAL/PLATELET
BASOS PCT: 0.8 % (ref 0.0–3.0)
Basophils Absolute: 0 10*3/uL (ref 0.0–0.1)
EOS ABS: 0.2 10*3/uL (ref 0.0–0.7)
Eosinophils Relative: 4.4 % (ref 0.0–5.0)
HCT: 39.3 % (ref 39.0–52.0)
HEMOGLOBIN: 12.9 g/dL — AB (ref 13.0–17.0)
LYMPHS ABS: 1.9 10*3/uL (ref 0.7–4.0)
Lymphocytes Relative: 38.4 % (ref 12.0–46.0)
MCHC: 32.9 g/dL (ref 30.0–36.0)
MCV: 82.8 fl (ref 78.0–100.0)
MONO ABS: 0.4 10*3/uL (ref 0.1–1.0)
Monocytes Relative: 8.2 % (ref 3.0–12.0)
NEUTROS PCT: 48.2 % (ref 43.0–77.0)
Neutro Abs: 2.4 10*3/uL (ref 1.4–7.7)
PLATELETS: 366 10*3/uL (ref 150.0–400.0)
RBC: 4.75 Mil/uL (ref 4.22–5.81)
RDW: 14.9 % (ref 11.5–15.5)
WBC: 4.9 10*3/uL (ref 4.0–10.5)

## 2015-11-15 LAB — POCT URINALYSIS DIPSTICK
Bilirubin, UA: NEGATIVE
Glucose, UA: NEGATIVE
Ketones, UA: NEGATIVE
Leukocytes, UA: NEGATIVE
Nitrite, UA: NEGATIVE
Protein, UA: NEGATIVE
Spec Grav, UA: 1.02
Urobilinogen, UA: 0.2
pH, UA: 5.5

## 2015-11-15 LAB — TSH: TSH: 1.54 u[IU]/mL (ref 0.35–4.50)

## 2015-11-15 MED ORDER — LISINOPRIL-HYDROCHLOROTHIAZIDE 20-12.5 MG PO TABS: 1.0000 | ORAL_TABLET | Freq: Every day | ORAL | Status: DC

## 2015-11-15 MED ORDER — AMLODIPINE BESYLATE 2.5 MG PO TABS: 2.5000 mg | ORAL_TABLET | Freq: Every day | ORAL | Status: DC

## 2015-11-15 NOTE — Progress Notes (Signed)
   Subjective:    Patient ID: Joshua Castaneda, male    DOB: October 29, 1965, 50 y.o.   MRN: [id number]  HPI Joshua Castaneda is a 50 year old married male nonsmoker who comes in today for evaluation of high blood pressure  He was here last week and saw Dr. Tylene Fantasia. His blood pressure was slightly elevated. He's been off his Hydrocort thiazide and lisinopril for many years. BP here when he came in today was 220/120 repeat by me 200/120. He was asymptomatic. He was immediately of clonidine 0.2 and a beta blocker 5 mg and placed at bedrest.  Review of Systems    review of systems otherwise negative Objective:   Physical Exam  Well-developed well-nourished male no acute distress vital signs stable he is afebrile except for BP 220/120  As noted above he was given clonidine 0.2 and a beta blocker 5 mg............... BP will be monitored every 15 minutes until starts to come down      Assessment & Plan:  Hypertension malignant..........Marland Kitchen restart medication...Marland KitchenMarland KitchenMarland Kitchen bedrest at home.... No salt diet........... BP check 3 times daily...Marland KitchenMarland KitchenMarland Kitchen follow-up on Friday.

## 2015-11-15 NOTE — Patient Instructions (Signed)
Lisinopril 20 mg............... one tablet now then starting tomorrow morning one tablet every morning  Norvasc  2.5 mg............ one tablet now.......... then 1 tablet every morning  Complete salt free time  Bedrest at home  Check your blood pressure 3 times a day  Return on Friday for follow-up with a record of all your blood pressure readings and the device

## 2015-11-15 NOTE — Progress Notes (Signed)
Pre visit review using our clinic review tool, if applicable. No additional management support is needed unless otherwise documented below in the visit note. 

## 2015-11-17 ENCOUNTER — Ambulatory Visit (INDEPENDENT_AMBULATORY_CARE_PROVIDER_SITE_OTHER): Payer: Managed Care, Other (non HMO) | Admitting: Adult Health

## 2015-11-17 ENCOUNTER — Encounter: Payer: Self-pay | Admitting: Adult Health

## 2015-11-17 VITALS — BP 160/100 | HR 90 | Temp 98.6°F | Wt 245.2 lb

## 2015-11-17 DIAGNOSIS — I10 Essential (primary) hypertension: Secondary | ICD-10-CM

## 2015-11-17 NOTE — Progress Notes (Signed)
Subjective:    Patient ID: Joshua Castaneda, male    DOB: 1966-01-31, 50 y.o.   MRN: [id number]  HPI  50 year old male who, patient of Dr. Tawanna Cooler who presents to the office today for follow up regarding blood pressure. He last saw Dr. Tawanna Cooler on 11/15/2015 at which time it was revealed that his BP was 200/120, he was given 02 mg clonidine and 5 mg BB.   He was restarted on Zestoretic 20-12.5 and Norvasc 5 mg  He reports that he took his blood pressure medication last night and when he monitored it today it was in the 160's/90s. He denies any headaches or blurred vision.     Review of Systems  Constitutional: Negative.   Respiratory: Negative.   Cardiovascular: Negative.   Neurological: Negative.   All other systems reviewed and are negative.  Past Medical History  Diagnosis Date  . DVT (deep venous thrombosis) (HCC)     had LLE dvt in 1997; no clear provocation.   . Metabolic syndrome   . Hematuria     neg work up with Urology.   . Hypertension     Social History   Social History  . Marital Status: Single    Spouse Name: N/A  . Number of Children: 1  . Years of Education: N/A   Occupational History  .  [employer]    active the field.    Social History Main Topics  . Smoking status: Never Smoker   . Smokeless tobacco: Never Used  . Alcohol Use: No  . Drug Use: No  . Sexual Activity: Not on file   Other Topics Concern  . Not on file   Social History Narrative   wortks with [employer]   [place]  Has wife and kids liv [place]     No past surgical history on file.  Family History  Problem Relation Age of Onset  . Heart failure Father   . Stroke Mother   . Aneurysm Mother   . Hypertension Sister   . Hypertension Brother     Allergies  Allergen Reactions  . Shellfish Allergy     REACTION: throat swells  . Shrimp Flavor     REACTION: throat swells    Current Outpatient Prescriptions on File Prior to Visit    Medication Sig Dispense Refill  . amLODipine (NORVASC) 2.5 MG tablet Take 1 tablet (2.5 mg total) by mouth daily. 90 tablet 3  . lisinopril-hydrochlorothiazide (ZESTORETIC) 20-12.5 MG tablet Take 1 tablet by mouth daily. 90 tablet 3  . XARELTO 20 MG TABS tablet take 1 tablet by mouth once daily 100 tablet 0   No current facility-administered medications on file prior to visit.    BP 160/100 mmHg  Pulse 90  Temp(Src) 98.6 F (37 C) (Oral)  Wt 245 lb 3.2 oz (111.222 kg)       Objective:   Physical Exam  Constitutional: He is oriented to person, place, and time. He appears well-developed and well-nourished. No distress.  Cardiovascular: Normal rate, regular rhythm, normal heart sounds and intact distal pulses.  Exam reveals no gallop and no friction rub.   No murmur heard. Pulmonary/Chest: Effort normal and breath sounds normal. No respiratory distress. He has no wheezes. He has no rales. He exhibits no tenderness.  Neurological: He is alert and oriented to person, place, and time.  Skin: Skin is warm and dry. No rash noted. He is not diaphoretic. No  erythema. No pallor.  Psychiatric: He has a normal mood and affect. His behavior is normal. Thought content normal.  Nursing note and vitals reviewed.      Assessment & Plan:  1. Essential hypertension - Continue with same medication - Take blood pressure medication at the same time every night.  - Continue to monitor BP at home and bring log to next appointment.  - Follow up on 11/22/2015 - Follow up sooner if needed - Go to the ER with increased blood pressure readings, blurred vision or headaches.  - Follow up sooner if

## 2015-11-17 NOTE — Progress Notes (Signed)
Pre visit review using our clinic review tool, if applicable. No additional management support is needed unless otherwise documented below in the visit note. 

## 2015-11-17 NOTE — Patient Instructions (Signed)
It was great meeting you today  Continue to take your blood pressure medication at night. Monitor your blood pressure three times a day and write down the results. Bring the log to your next appointment.   Refrain from strenuous exercise  Go to the ER with headaches or blurred vision

## 2015-11-22 ENCOUNTER — Ambulatory Visit (INDEPENDENT_AMBULATORY_CARE_PROVIDER_SITE_OTHER): Payer: Managed Care, Other (non HMO) | Admitting: Adult Health

## 2015-11-22 ENCOUNTER — Encounter: Payer: Self-pay | Admitting: Adult Health

## 2015-11-22 VITALS — BP 120/90 | Temp 98.8°F | Ht 72.25 in | Wt 244.9 lb

## 2015-11-22 DIAGNOSIS — I10 Essential (primary) hypertension: Secondary | ICD-10-CM | POA: Diagnosis not present

## 2015-11-22 NOTE — Progress Notes (Signed)
Subjective:    Patient ID: Joshua Castaneda, male    DOB: 05-Nov-1965, 50 y.o.   MRN: [id number]  HPI  50 year old male who returns for follow up regarding blood pressure. I saw him a week ago after restarting Zestoretic and Norvasc.   He has been monitoring his blood pressure at home but reports that his machine has not been working well and it sometimes takes multiple tries to get a blood pressure reading.   In the office today it was noticed that his blood pressure cuff was too small.   With our cuff his blood pressure was 120/90 and 124/82.   He denies any headaches, blurred vision, or lightheadedness.   Review of Systems  Constitutional: Negative.   Respiratory: Negative.   Cardiovascular: Negative.   Neurological: Negative.   All other systems reviewed and are negative.  Past Medical History  Diagnosis Date  . DVT (deep venous thrombosis) (HCC)     had LLE dvt in 1997; no clear provocation.   . Metabolic syndrome   . Hematuria     neg work up with Urology.   . Hypertension     Social History   Social History  . Marital Status: Single    Spouse Name: N/A  . Number of Children: 1  . Years of Education: N/A   Occupational History  .  [employer]    active the field.    Social History Main Topics  . Smoking status: Never Smoker   . Smokeless tobacco: Never Used  . Alcohol Use: No  . Drug Use: No  . Sexual Activity: Not on file   Other Topics Concern  . Not on file   Social History Narrative   wortks with [employer]   [place]  Has wife and kids liv [place]     No past surgical history on file.  Family History  Problem Relation Age of Onset  . Heart failure Father   . Stroke Mother   . Aneurysm Mother   . Hypertension Sister   . Hypertension Brother     Allergies  Allergen Reactions  . Shellfish Allergy     REACTION: throat swells  . Shrimp Flavor     REACTION: throat swells    Current Outpatient  Prescriptions on File Prior to Visit  Medication Sig Dispense Refill  . amLODipine (NORVASC) 2.5 MG tablet Take 1 tablet (2.5 mg total) by mouth daily. 90 tablet 3  . lisinopril-hydrochlorothiazide (ZESTORETIC) 20-12.5 MG tablet Take 1 tablet by mouth daily. 90 tablet 3  . XARELTO 20 MG TABS tablet take 1 tablet by mouth once daily 100 tablet 0   No current facility-administered medications on file prior to visit.    BP 120/90 mmHg  Temp(Src) 98.8 F (37.1 C) (Oral)  Ht 6' 0.25" (1.835 m)  Wt 244 lb 14.4 oz (111.086 kg)  BMI 32.99 kg/m2       Objective:   Physical Exam  Constitutional: He is oriented to person, place, and time. He appears well-developed and well-nourished. No distress.  Cardiovascular: Normal rate, regular rhythm, normal heart sounds and intact distal pulses.  Exam reveals no gallop and no friction rub.   No murmur heard. Pulmonary/Chest: Effort normal and breath sounds normal. No respiratory distress. He has no wheezes. He has no rales. He exhibits no tenderness.  Neurological: He is alert and oriented to person, place, and time.  Skin: Skin is warm and dry.  No rash noted. He is not diaphoretic. No erythema. No pallor.  Psychiatric: He has a normal mood and affect. His behavior is normal. Judgment and thought content normal.  Nursing note and vitals reviewed.     Assessment & Plan:  1. Essential hypertension - No change in medication - Get a machine with a larger cuff - Monitor at home and inform me if BP goes above 150 consistently.  - Follow up in one month.

## 2015-11-22 NOTE — Patient Instructions (Signed)
It was great seeing you again!  Your blood pressure is normal today in the office. I think your blood pressure cuff is to small. Stop by [facility] medical supply and see if they have a bigger cuff.   Continue to monitor at home, let me know if your blood pressure continues to go up.   Follow up in one month

## 2015-11-22 NOTE — Progress Notes (Signed)
Pre visit review using our clinic review tool, if applicable. No additional management support is needed unless otherwise documented below in the visit note. 

## 2015-11-24 ENCOUNTER — Other Ambulatory Visit: Payer: Self-pay | Admitting: *Deleted

## 2015-11-24 MED ORDER — RIVAROXABAN 20 MG PO TABS: 20.0000 mg | ORAL_TABLET | Freq: Every day | ORAL | Status: DC

## 2016-02-28 ENCOUNTER — Telehealth: Payer: Self-pay | Admitting: Family Medicine

## 2016-02-28 NOTE — Telephone Encounter (Signed)
[facility] needs a verbal PA [phone]) for rivaroxaban (XARELTO) 20 MG TABS tablet

## 2016-02-29 ENCOUNTER — Other Ambulatory Visit: Payer: Self-pay | Admitting: General Practice

## 2016-02-29 MED ORDER — RIVAROXABAN 20 MG PO TABS: 20.0000 mg | ORAL_TABLET | Freq: Every day | ORAL | Status: DC

## 2016-02-29 NOTE — Telephone Encounter (Signed)
<  HTML><META HTTP-EQUIV="content-type" CONTENT="text/html;charset=utf-8"><P>[facility] is processing your PA request and will respond shortly with next steps.  You are currently using the fastest method to process this prior authorization. Please do not fax or call [facility] to resubmit this request.  To check for an update later, open this request again from your dashboard.</P><P> If you need assistance, please chat with [employer]Meds or call us at [phone].</P>

## 2016-03-15 ENCOUNTER — Other Ambulatory Visit: Payer: Self-pay

## 2016-03-15 MED ORDER — RIVAROXABAN 20 MG PO TABS
20.0000 mg | ORAL_TABLET | Freq: Every day | ORAL | Status: DC
Start: 2016-03-15 — End: 2016-11-05

## 2016-10-03 ENCOUNTER — Encounter: Payer: Self-pay | Admitting: Adult Health

## 2016-10-03 ENCOUNTER — Ambulatory Visit (INDEPENDENT_AMBULATORY_CARE_PROVIDER_SITE_OTHER): Payer: Managed Care, Other (non HMO) | Admitting: Adult Health

## 2016-10-03 VITALS — BP 182/90 | Temp 98.3°F | Ht 72.25 in | Wt 254.0 lb

## 2016-10-03 DIAGNOSIS — I10 Essential (primary) hypertension: Secondary | ICD-10-CM | POA: Diagnosis not present

## 2016-10-03 DIAGNOSIS — R42 Dizziness and giddiness: Secondary | ICD-10-CM | POA: Diagnosis not present

## 2016-10-03 MED ORDER — LISINOPRIL-HYDROCHLOROTHIAZIDE 20-12.5 MG PO TABS: 1.0000 | ORAL_TABLET | Freq: Every day | ORAL | 3 refills | Status: DC

## 2016-10-03 MED ORDER — AMLODIPINE BESYLATE 2.5 MG PO TABS: 2.5000 mg | ORAL_TABLET | Freq: Every day | ORAL | 3 refills | Status: DC

## 2016-10-03 NOTE — Progress Notes (Signed)
Subjective:    Patient ID: Joshua Castaneda, male    DOB: 1966/10/09, 50 y.o.   MRN: [id number]  HPI   50 year old male who presents to the office today for the complaint of dizziness. He reports that over the last 2-3 days he has had episodes of dizziness. These episodes last for a few seconds and then resolve. He feels as though the room is spinning during these visits. He also complains of episodic headaches over the last 2-3 days. The headache is located along the right side of his temple.   He denies in blurred vision. He has been without his blood pressure medication for an unknown amount of time but reports " it has been awhile." Today in the office his BP is 182/90 on two separate occassions.   He denies any blurred vision   Review of Systems  HENT: Negative.   Respiratory: Negative.   Cardiovascular: Negative.   Musculoskeletal: Negative.   Neurological: Positive for dizziness and headaches.  All other systems reviewed and are negative.  Past Medical History:  Diagnosis Date  . DVT (deep venous thrombosis) (HCC)    had LLE dvt in 1997; no clear provocation.   . Hematuria    neg work up with Urology.   . Hypertension   . Metabolic syndrome     Social History   Social History  . Marital status: Single    Spouse name: N/A  . Number of children: 1  . Years of education: N/A   Occupational History  .  [employer]    active the field.    Social History Main Topics  . Smoking status: Never Smoker  . Smokeless tobacco: Never Used  . Alcohol use No  . Drug use: No  . Sexual activity: Not on file   Other Topics Concern  . Not on file   Social History Narrative   wortks with [employer]   [place]  Has wife and kids liv [place]     No past surgical history on file.  Family History  Problem Relation Age of Onset  . Heart failure Father   . Stroke Mother   . Aneurysm Mother   . Hypertension Sister   . Hypertension  Brother     Allergies  Allergen Reactions  . Shellfish Allergy     REACTION: throat swells  . Shrimp Flavor     REACTION: throat swells    Current Outpatient Prescriptions on File Prior to Visit  Medication Sig Dispense Refill  . rivaroxaban (XARELTO) 20 MG TABS tablet Take 1 tablet (20 mg total) by mouth daily. 90 tablet 1   No current facility-administered medications on file prior to visit.     BP (!) 182/90   Temp 98.3 F (36.8 C) (Oral)   Ht 6' 0.25" (1.835 m)   Wt 254 lb (115.2 kg)   BMI 34.21 kg/m       Objective:   Physical Exam  Constitutional: He is oriented to person, place, and time. He appears well-developed and well-nourished. No distress.  Eyes: Conjunctivae and EOM are normal. Pupils are equal, round, and reactive to light. Right eye exhibits no discharge. Left eye exhibits no discharge. No scleral icterus. Right eye exhibits no nystagmus. Left eye exhibits no nystagmus.  Neck: Normal range of motion. Neck supple. No thyromegaly present.  Cardiovascular: Normal rate, regular rhythm, normal heart sounds and intact distal pulses.  Exam reveals no gallop and  no friction rub.   No murmur heard. Pulmonary/Chest: Effort normal. No respiratory distress. He has no wheezes. He has no rales. He exhibits no tenderness.  Lymphadenopathy:    He has no cervical adenopathy.  Neurological: He is alert and oriented to person, place, and time.  Skin: Skin is warm and dry. No rash noted. He is not diaphoretic. No erythema. No pallor.  Psychiatric: He has a normal mood and affect. His behavior is normal. Judgment and thought content normal.  Nursing note and vitals reviewed.     Assessment & Plan:  1. Dizziness - likely due to hypertension.  - Restart medications and follow up in one week   2. Essential hypertension - amLODipine (NORVASC) 2.5 MG tablet; Take 1 tablet (2.5 mg total) by mouth daily.  Dispense: 30 tablet; Refill: 3 - lisinopril-hydrochlorothiazide  (ZESTORETIC) 20-12.5 MG tablet; Take 1 tablet by mouth daily.  Dispense: 30 tablet; Refill: 3 - Follow up in one week  Shirline Freesory Katelynd Blauvelt, NP

## 2016-10-09 ENCOUNTER — Ambulatory Visit (INDEPENDENT_AMBULATORY_CARE_PROVIDER_SITE_OTHER): Payer: Managed Care, Other (non HMO) | Admitting: Adult Health

## 2016-10-09 ENCOUNTER — Encounter: Payer: Self-pay | Admitting: Adult Health

## 2016-10-09 VITALS — BP 150/92 | Temp 98.1°F | Ht 72.25 in | Wt 249.0 lb

## 2016-10-09 DIAGNOSIS — I10 Essential (primary) hypertension: Secondary | ICD-10-CM

## 2016-10-09 MED ORDER — AMLODIPINE BESYLATE 5 MG PO TABS: 5.0000 mg | ORAL_TABLET | Freq: Every day | ORAL | 1 refills | Status: DC

## 2016-10-09 NOTE — Progress Notes (Signed)
Subjective:    Patient ID: Joshua Castaneda, male    DOB: April 14, 1966, 50 y.o.   MRN: [id number]  HPI  50 year old male who presents to the office today for one week follow up regarding hypertension. I last saw him on 10/03/2016 at which time his blood pressure was 150/92. He was complaining of dizziness and headache. He was not taking his blood pressure medication at that time. He was restarted on Norvasc 2.5mg  and Zestoretic 20-12.5.   Today in the office he reports that he has been taking his medications daily and has been checking his blood pressures at home. His log shows blood pressures between 135-150/80's.   He no longer has dizziness or headaches.     Review of Systems  Constitutional: Negative.   Respiratory: Negative.   Cardiovascular: Negative.   Neurological: Negative.   All other systems reviewed and are negative.  Past Medical History:  Diagnosis Date  . DVT (deep venous thrombosis) (HCC)    had LLE dvt in 1997; no clear provocation.   . Hematuria    neg work up with Urology.   . Hypertension   . Metabolic syndrome     Social History   Social History  . Marital status: Single    Spouse name: N/A  . Number of children: 1  . Years of education: N/A   Occupational History  .  [employer]    active the field.    Social History Main Topics  . Smoking status: Never Smoker  . Smokeless tobacco: Never Used  . Alcohol use No  . Drug use: No  . Sexual activity: Not on file   Other Topics Concern  . Not on file   Social History Narrative   wortks with [employer]   [place]  Has wife and kids [place]     No past surgical history on file.  Family History  Problem Relation Age of Onset  . Heart failure Father   . Stroke Mother   . Aneurysm Mother   . Hypertension Sister   . Hypertension Brother     Allergies  Allergen Reactions  . Shellfish Allergy     REACTION: throat swells  . Shrimp Flavor    REACTION: throat swells    Current Outpatient Prescriptions on File Prior to Visit  Medication Sig Dispense Refill  . amLODipine (NORVASC) 2.5 MG tablet Take 1 tablet (2.5 mg total) by mouth daily. 30 tablet 3  . lisinopril-hydrochlorothiazide (ZESTORETIC) 20-12.5 MG tablet Take 1 tablet by mouth daily. 30 tablet 3  . rivaroxaban (XARELTO) 20 MG TABS tablet Take 1 tablet (20 mg total) by mouth daily. 90 tablet 1   No current facility-administered medications on file prior to visit.     BP (!) 150/92   Temp 98.1 F (36.7 C) (Oral)   Ht 6' 0.25" (1.835 m)   Wt 249 lb (112.9 kg)   BMI 33.54 kg/m       Objective:   Physical Exam  Constitutional: He is oriented to person, place, and time. He appears well-developed and well-nourished.  Cardiovascular: Normal rate, regular rhythm, normal heart sounds and intact distal pulses.  Exam reveals no gallop.   No murmur heard. Pulmonary/Chest: Effort normal and breath sounds normal. No respiratory distress. He has no wheezes. He has no rales. He exhibits no tenderness.  Neurological: He is alert and oriented to person, place, and time.  Skin: Skin is warm and  dry. No rash noted. He is not diaphoretic. No erythema. No pallor.  Psychiatric: He has a normal mood and affect. His behavior is normal. Judgment and thought content normal.  Nursing note and vitals reviewed.     Assessment & Plan:  1. Essential hypertension - Will increase amlodipine to 5 mg - amLODipine (NORVASC) 5 MG tablet; Take 1 tablet (5 mg total) by mouth daily.  Dispense: 90 tablet; Refill: 1 - Continue to work on diet and exercise - Follow up with at CPE - He is going to send me his blood pressure results via MyChart  Shirline Freesory Krishon Adkison, NP

## 2016-10-17 ENCOUNTER — Other Ambulatory Visit: Payer: Self-pay

## 2016-10-17 ENCOUNTER — Other Ambulatory Visit (INDEPENDENT_AMBULATORY_CARE_PROVIDER_SITE_OTHER): Payer: Managed Care, Other (non HMO)

## 2016-10-17 DIAGNOSIS — Z0000 Encounter for general adult medical examination without abnormal findings: Secondary | ICD-10-CM

## 2016-10-17 DIAGNOSIS — R319 Hematuria, unspecified: Secondary | ICD-10-CM

## 2016-10-17 LAB — URINALYSIS, MICROSCOPIC ONLY: WBC, UA: NONE SEEN (ref 0–?)

## 2016-10-17 LAB — HEPATIC FUNCTION PANEL
ALBUMIN: 4.3 g/dL (ref 3.5–5.2)
ALK PHOS: 71 U/L (ref 39–117)
ALT: 15 U/L (ref 0–53)
AST: 15 U/L (ref 0–37)
Bilirubin, Direct: 0.1 mg/dL (ref 0.0–0.3)
TOTAL PROTEIN: 7 g/dL (ref 6.0–8.3)
Total Bilirubin: 0.6 mg/dL (ref 0.2–1.2)

## 2016-10-17 LAB — CBC WITH DIFFERENTIAL/PLATELET
BASOS ABS: 0.1 10*3/uL (ref 0.0–0.1)
BASOS PCT: 1.2 % (ref 0.0–3.0)
EOS ABS: 0.2 10*3/uL (ref 0.0–0.7)
Eosinophils Relative: 4.7 % (ref 0.0–5.0)
HEMATOCRIT: 36.8 % — AB (ref 39.0–52.0)
HEMOGLOBIN: 12.4 g/dL — AB (ref 13.0–17.0)
Lymphocytes Relative: 51.6 % — ABNORMAL HIGH (ref 12.0–46.0)
Lymphs Abs: 2.5 10*3/uL (ref 0.7–4.0)
MCHC: 33.6 g/dL (ref 30.0–36.0)
MCV: 82.5 fl (ref 78.0–100.0)
Monocytes Absolute: 0.4 10*3/uL (ref 0.1–1.0)
Monocytes Relative: 8.2 % (ref 3.0–12.0)
NEUTROS ABS: 1.7 10*3/uL (ref 1.4–7.7)
NEUTROS PCT: 34.3 % — AB (ref 43.0–77.0)
Platelets: 345 10*3/uL (ref 150.0–400.0)
RBC: 4.46 Mil/uL (ref 4.22–5.81)
RDW: 14.6 % (ref 11.5–15.5)
WBC: 4.9 10*3/uL (ref 4.0–10.5)

## 2016-10-17 LAB — POC URINALSYSI DIPSTICK (AUTOMATED)
Bilirubin, UA: NEGATIVE
Glucose, UA: NEGATIVE
Ketones, UA: NEGATIVE
Leukocytes, UA: NEGATIVE
NITRITE UA: NEGATIVE
PH UA: 5
PROTEIN UA: NEGATIVE
Spec Grav, UA: 1.015
UROBILINOGEN UA: 0.2

## 2016-10-17 LAB — BASIC METABOLIC PANEL
BUN: 22 mg/dL (ref 6–23)
CALCIUM: 9.4 mg/dL (ref 8.4–10.5)
CHLORIDE: 103 meq/L (ref 96–112)
CO2: 30 mEq/L (ref 19–32)
Creatinine, Ser: 1.52 mg/dL — ABNORMAL HIGH (ref 0.40–1.50)
GFR: 62.78 mL/min (ref >60.00)
Glucose, Bld: 139 mg/dL — ABNORMAL HIGH (ref 70–99)
Potassium: 3.9 mEq/L (ref 3.5–5.1)
Sodium: 139 mEq/L (ref 135–145)

## 2016-10-17 LAB — LIPID PANEL
CHOLESTEROL: 244 mg/dL — AB (ref 0–200)
HDL: 56.6 mg/dL (ref >39.00)
LDL Cholesterol: 170 mg/dL — ABNORMAL HIGH (ref 0–99)
NonHDL: 187.37
TRIGLYCERIDES: 85 mg/dL (ref 0.0–149.0)
Total CHOL/HDL Ratio: 4
VLDL: 17 mg/dL (ref 0.0–40.0)

## 2016-10-17 LAB — PSA: PSA: 1.8 ng/mL (ref 0.10–4.00)

## 2016-10-17 LAB — TSH: TSH: 1.39 u[IU]/mL (ref 0.35–4.50)

## 2016-10-23 ENCOUNTER — Encounter: Payer: Self-pay | Admitting: Adult Health

## 2016-10-23 ENCOUNTER — Encounter: Payer: Self-pay | Admitting: Gastroenterology

## 2016-10-23 ENCOUNTER — Ambulatory Visit (INDEPENDENT_AMBULATORY_CARE_PROVIDER_SITE_OTHER): Payer: Managed Care, Other (non HMO) | Admitting: Adult Health

## 2016-10-23 VITALS — BP 138/86 | Temp 97.9°F | Resp 16 | Wt 252.7 lb

## 2016-10-23 DIAGNOSIS — I10 Essential (primary) hypertension: Secondary | ICD-10-CM | POA: Diagnosis not present

## 2016-10-23 DIAGNOSIS — Z1211 Encounter for screening for malignant neoplasm of colon: Secondary | ICD-10-CM

## 2016-10-23 DIAGNOSIS — Z0000 Encounter for general adult medical examination without abnormal findings: Secondary | ICD-10-CM

## 2016-10-23 DIAGNOSIS — Z86718 Personal history of other venous thrombosis and embolism: Secondary | ICD-10-CM

## 2016-10-23 DIAGNOSIS — E119 Type 2 diabetes mellitus without complications: Secondary | ICD-10-CM | POA: Insufficient documentation

## 2016-10-23 LAB — POCT GLYCOSYLATED HEMOGLOBIN (HGB A1C): HEMOGLOBIN A1C: 6.2

## 2016-10-23 MED ORDER — LISINOPRIL-HYDROCHLOROTHIAZIDE 20-12.5 MG PO TABS: 1.0000 | ORAL_TABLET | Freq: Every day | ORAL | 3 refills | Status: DC

## 2016-10-23 MED ORDER — AMLODIPINE BESYLATE 5 MG PO TABS: 5.0000 mg | ORAL_TABLET | Freq: Every day | ORAL | 3 refills | Status: DC

## 2016-10-23 NOTE — Progress Notes (Signed)
Patient presents to clinic today to establish care. He is a pleasant 51 year old male who  has a past medical history of Diabetes type 2, controlled (HCC); DVT (deep venous thrombosis) (HCC); Hematuria; Hypertension; Metabolic syndrome; and Pulmonary embolism (HCC).  Acute Concerns: Complete Physical   Chronic Issues: Essential Hypertension  - Not controlled at this time. Has recently increased Amlodipine from 5 mg to 10 mg and also takes Zestoretic 25-12.5 mg   Hx of DVT/PE  - Zane Herald daily.   DM II - diet controlled  Health Maintenance: Dental -- Routine Care [facility] -- Does not do routine care  Immunizations -- Needs Tdap  Colonoscopy -- Never had  Diet: He tries to eat healthy.  Exercise: Has been doing aerobic exercise. He walks about 4 miles per day   Past Medical History:  Diagnosis Date  . Diabetes type 2, controlled (HCC)   . DVT (deep venous thrombosis) (HCC)    had LLE dvt in 1997; no clear provocation.   . Hematuria    neg work up with Urology.   . Hypertension   . Metabolic syndrome   . Pulmonary embolism (HCC)     No past surgical history on file.  Current Outpatient Prescriptions on File Prior to Visit  Medication Sig Dispense Refill  . amLODipine (NORVASC) 5 MG tablet Take 1 tablet (5 mg total) by mouth daily. 90 tablet 1  . lisinopril-hydrochlorothiazide (ZESTORETIC) 20-12.5 MG tablet Take 1 tablet by mouth daily. 30 tablet 3  . rivaroxaban (XARELTO) 20 MG TABS tablet Take 1 tablet (20 mg total) by mouth daily. 90 tablet 1   No current facility-administered medications on file prior to visit.     Allergies  Allergen Reactions  . Shellfish Allergy     REACTION: throat swells  . Shrimp Flavor     REACTION: throat swells    Family History  Problem Relation Age of Onset  . Heart failure Father   . Stroke Mother   . Aneurysm Mother   . Hypertension Sister   . Hypertension Brother   . Diabetes Brother     Social History   Social  History  . Marital status: Single    Spouse name: N/A  . Number of children: 1  . Years of education: N/A   Occupational History  .  [employer]    active the field.    Social History Main Topics  . Smoking status: Never Smoker  . Smokeless tobacco: Never Used  . Alcohol use No  . Drug use: No  . Sexual activity: Not on file   Other Topics Concern  . Not on file   Social History Narrative   wortks with [employer]   Originally form [place]  Has wife and kids liv [place]     Review of Systems  Constitutional: Negative.   HENT: Negative.   Eyes: Negative.   Respiratory: Negative.   Cardiovascular: Negative.   Gastrointestinal: Negative.   Genitourinary: Negative.   Musculoskeletal: Negative.   Skin: Negative.   Neurological: Negative.   Psychiatric/Behavioral: Negative.   All other systems reviewed and are negative.   BP 138/86   Temp 97.9 F (36.6 C)   Resp 16   Wt 252 lb 11.2 oz (114.6 kg)   BMI 34.04 kg/m   Physical Exam  Constitutional: He is oriented to person, place, and time and well-developed, well-nourished, and in no distress. No distress.  Slightly obese  HENT:  Head: Normocephalic and atraumatic.  Right Ear: External ear normal.  Left Ear: External ear normal.  Nose: Nose normal.  Mouth/Throat: Oropharynx is clear and moist. No oropharyngeal exudate.  Eyes: Conjunctivae and EOM are normal. Pupils are equal, round, and reactive to light. Right eye exhibits no discharge. Left eye exhibits no discharge. No scleral icterus.  Neck: Normal range of motion. Neck supple. No JVD present. Carotid bruit is not present. No tracheal deviation present. No thyromegaly present.  Cardiovascular: Normal rate, regular rhythm, normal heart sounds and intact distal pulses.  Exam reveals no gallop and no friction rub.   No murmur heard. Pulmonary/Chest: Effort normal and breath sounds normal. No stridor. No respiratory distress. He has no  wheezes. He has no rales. He exhibits no tenderness.  Abdominal: Soft. Bowel sounds are normal. He exhibits no distension and no mass. There is no tenderness. There is no rebound and no guarding.  Genitourinary: Prostate normal. Rectal exam shows guaiac negative stool.  Musculoskeletal: Normal range of motion. He exhibits no edema, tenderness or deformity.  Lymphadenopathy:    He has no cervical adenopathy.  Neurological: He is alert and oriented to person, place, and time. He displays normal reflexes. No cranial nerve deficit. He exhibits normal muscle tone. Coordination normal. GCS score is 15.  Skin: Skin is warm and dry. No rash noted. He is not diaphoretic. No erythema. No pallor.  Psychiatric: Mood, memory, affect and judgment normal.  Nursing note and vitals reviewed.   Recent Results (from the past 2160 hour(s))  Basic metabolic panel     Status: Abnormal   Collection Time: 10/17/16  8:25 AM  Result Value Ref Range   Sodium 139 135 - 145 mEq/L   Potassium 3.9 3.5 - 5.1 mEq/L   Chloride 103 96 - 112 mEq/L   CO2 30 19 - 32 mEq/L   Glucose, Bld 139 (H) 70 - 99 mg/dL   BUN 22 6 - 23 mg/dL   Creatinine, Ser 0.981.52 (H) 0.40 - 1.50 mg/dL   Calcium 9.4 8.4 - 11.910.5 mg/dL   GFR 14.7862.78 >29.56>60.00 mL/min  CBC with Differential/Platelet     Status: Abnormal   Collection Time: 10/17/16  8:25 AM  Result Value Ref Range   WBC 4.9 4.0 - 10.5 K/uL   RBC 4.46 4.22 - 5.81 Mil/uL   Hemoglobin 12.4 (L) 13.0 - 17.0 g/dL   HCT 21.336.8 (L) 08.639.0 - 57.852.0 %   MCV 82.5 78.0 - 100.0 fl   MCHC 33.6 30.0 - 36.0 g/dL   RDW 46.914.6 62.911.5 - 52.815.5 %   Platelets 345.0 150.0 - 400.0 K/uL   Neutrophils Relative % 34.3 (L) 43.0 - 77.0 %   Lymphocytes Relative 51.6 (H) 12.0 - 46.0 %   Monocytes Relative 8.2 3.0 - 12.0 %   Eosinophils Relative 4.7 0.0 - 5.0 %   Basophils Relative 1.2 0.0 - 3.0 %   Neutro Abs 1.7 1.4 - 7.7 K/uL   Lymphs Abs 2.5 0.7 - 4.0 K/uL   Monocytes Absolute 0.4 0.1 - 1.0 K/uL   Eosinophils Absolute  0.2 0.0 - 0.7 K/uL   Basophils Absolute 0.1 0.0 - 0.1 K/uL  Hepatic function panel     Status: None   Collection Time: 10/17/16  8:25 AM  Result Value Ref Range   Total Bilirubin 0.6 0.2 - 1.2 mg/dL   Bilirubin, Direct 0.1 0.0 - 0.3 mg/dL   Alkaline Phosphatase 71 39 - 117 U/L   AST 15 0 -  37 U/L   ALT 15 0 - 53 U/L   Total Protein 7.0 6.0 - 8.3 g/dL   Albumin 4.3 3.5 - 5.2 g/dL  Lipid panel     Status: Abnormal   Collection Time: 10/17/16  8:25 AM  Result Value Ref Range   Cholesterol 244 (H) 0 - 200 mg/dL    Comment: ATP III Classification       Desirable:  < 200 mg/dL               Borderline High:  200 - 239 mg/dL          High:  > = 161 mg/dL   Triglycerides 09.6 0.0 - 149.0 mg/dL    Comment: Normal:  <045 mg/dLBorderline High:  150 - 199 mg/dL   HDL 40.98 >11.91 mg/dL   VLDL 47.8 0.0 - 29.5 mg/dL   LDL Cholesterol 621 (H) 0 - 99 mg/dL   Total CHOL/HDL Ratio 4     Comment:                Men          Women1/2 Average Risk     3.4          3.3Average Risk          5.0          4.42X Average Risk          9.6          7.13X Average Risk          15.0          11.0                       NonHDL 187.37     Comment: NOTE:  Non-HDL goal should be 30 mg/dL higher than patient's LDL goal (i.e. LDL goal of < 70 mg/dL, would have non-HDL goal of < 100 mg/dL)  TSH     Status: None   Collection Time: 10/17/16  8:25 AM  Result Value Ref Range   TSH 1.39 0.35 - 4.50 uIU/mL  PSA     Status: None   Collection Time: 10/17/16  8:25 AM  Result Value Ref Range   PSA 1.80 0.10 - 4.00 ng/mL  POCT Urinalysis Dipstick (Automated)     Status: None   Collection Time: 10/17/16  9:24 AM  Result Value Ref Range   Color, UA yellow    Clarity, UA clear    Glucose, UA n    Bilirubin, UA n    Ketones, UA n    Spec Grav, UA 1.015    Blood, UA 2+    pH, UA 5.0    Protein, UA n    Urobilinogen, UA 0.2    Nitrite, UA n    Leukocytes, UA Negative Negative  Urine Microscopic     Status: Abnormal    Collection Time: 10/17/16  9:37 AM  Result Value Ref Range   WBC, UA none seen 0-2/hpf   RBC / HPF 0-2/hpf 0-2/hpf   Squamous Epithelial / LPF Rare(0-4/hpf) Rare(0-4/hpf)   Hyaline Casts, UA Presence of (A) None    Assessment/Plan: 1. Routine general medical examination at a health care facility - Reviewed labs in detail with patient and all questions answered.  - Work on losing weight through diet and exercise - Follow up in one year for CPE or sooner if needed  2. Essential hypertension - Has improved.  - Continue  to work on diet and exercise - No change in medication at this time 3. DVT, HX OF - Continue with Xeralto   4. Controlled type 2 diabetes mellitus without complication, without long-term current use of insulin (HCC)  - POC HgB A1c- 6.2  - Continue with diet and exercise - Follow up in 6 months   5. Colon cancer screening  - Ambulatory referral to Gastroenterology  Shirline Frees, NP

## 2016-10-31 ENCOUNTER — Ambulatory Visit (INDEPENDENT_AMBULATORY_CARE_PROVIDER_SITE_OTHER): Payer: Managed Care, Other (non HMO) | Admitting: Gastroenterology

## 2016-10-31 ENCOUNTER — Telehealth: Payer: Self-pay | Admitting: *Deleted

## 2016-10-31 ENCOUNTER — Encounter: Payer: Self-pay | Admitting: Gastroenterology

## 2016-10-31 VITALS — BP 160/70 | HR 88 | Ht 71.65 in | Wt 255.1 lb

## 2016-10-31 DIAGNOSIS — Z7901 Long term (current) use of anticoagulants: Secondary | ICD-10-CM

## 2016-10-31 DIAGNOSIS — Z1211 Encounter for screening for malignant neoplasm of colon: Secondary | ICD-10-CM | POA: Diagnosis not present

## 2016-10-31 NOTE — Telephone Encounter (Signed)
/  08/2017   RE: Joshua Castaneda DOB: Aug 08, 1966 MRN: [id number]   Dear  Tinnie GensJeffrey Todd,MD ,  Nadine Countsory Nafziger,MD   We have scheduled the above patient for an endoscopic procedure. Our records show that he is on anticoagulation therapy.   Please advise as to how long the patient may come off his therapy of Xarelto prior to the procedure, which is scheduled for 11/13/2016.  Please fax back/ or route the completed form to Anjenette Gerbino at [phone].   Sincerely,    Merri Rayobin Luwanda Starr ,CMA AAMA

## 2016-10-31 NOTE — Telephone Encounter (Signed)
Called pt  Ok to hold Xarelto 48 hours before procedure  Pt aware

## 2016-10-31 NOTE — Progress Notes (Signed)
10/31/2016 Clare Casto [id number] [birth year]   HISTORY OF PRESENT ILLNESS:  This is a 51 year old male who is here to schedule a screening colonoscopy.  Never had one in the past.  Does not have any GI issues/complaints.  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  Will be on this lifelong.  Says that he's held it before for route canals.  No CP, SOB, rectal bleeding.  Recent CBC, BMP, hepatic function panel, TSH WNL's.   Past Medical History:  Diagnosis Date  . Diabetes type 2, controlled (HCC)   . DVT (deep venous thrombosis) (HCC)    had LLE dvt in 1997; no clear provocation.   . Hematuria    neg work up with Urology.   . Hypertension   . Metabolic syndrome   . Pulmonary embolism [facility])    Past Surgical History:  Procedure Laterality Date  . NO PAST SURGERIES      reports that he has never smoked. He has never used smokeless tobacco. He reports that he drinks alcohol. He reports that he does not use drugs. family history includes Aneurysm in his mother; Clotting disorder in his father; Diabetes in his brother and sister; Heart failure in his father; Hypertension in his brother and sister; Stroke in his mother. Allergies  Allergen Reactions  . Shellfish Allergy     REACTION: throat swells  . Shrimp Flavor     REACTION: throat swells      Outpatient Encounter Prescriptions as of 10/31/2016  Medication Sig  . amLODipine (NORVASC) 2.5 MG tablet Take 1 tablet by mouth daily.  Marland Kitchen lisinopril-hydrochlorothiazide (ZESTORETIC) 20-12.5 MG tablet Take 1 tablet by mouth daily.  . rivaroxaban (XARELTO) 20 MG TABS tablet Take 1 tablet (20 mg total) by mouth daily.  Marland Kitchen amLODipine (NORVASC) 5 MG tablet Take 1 tablet (5 mg total) by mouth daily. (Patient not taking: Reported on 10/31/2016)   No facility-administered encounter medications on file as of 10/31/2016.      REVIEW OF SYSTEMS  : All other  systems reviewed and negative except where noted in the History of Present Illness.   PHYSICAL EXAM: BP (!) 160/70 (BP Location: Left Arm, Patient Position: Sitting, Cuff Size: Normal)   Pulse 88   Ht 5' 11.65" (1.82 m) Comment: height measured without shoes  Wt 255 lb 2 oz (115.7 kg)   BMI 34.94 kg/m  General: Well developed black male in no acute distress Head: Normocephalic and atraumatic Eyes:  Sclerae anicteric, conjunctiva pink. Ears: Normal auditory acuity Lungs: Clear throughout to auscultation; no increased WOB Heart: Regular rate and rhythm Abdomen: Soft, non-distended.  BS present.  Non-tender. Rectal:  Will be done at the time of colonoscopy. Musculoskeletal: Symmetrical with no gross deformities  Skin: No lesions on visible extremities Extremities: No edema  Neurological: Alert oriented x 4, grossly non-focal Psychological:  Alert and cooperative. Normal mood and affect  ASSESSMENT AND PLAN: -Screening colonoscopy:  Will schedule with Dr. Leone Payor. -Chronic anticoagulation on Xarelto for history of PE's with unknown source:  Will hold Xarelto for 1-2 days prior to endoscopic procedures - will instruct when and how to resume after procedure. Benefits and risks of procedure explained including risks of bleeding, perforation, infection, missed lesions, reactions to medications and possible need for hospitalization and surgery for complications. Additional rare but real risk of stroke or other vascular clotting events off of Xarelto also explained and need  to seek urgent help if any signs of these problems occur. Will communicate by phone or EMR with patient's prescribing provider, Sabino Dickody Nafzinger, NP, to confirm that holding Xarelto is reasonable in this case.    CC:  Roderick Peeodd, Jeffrey A, MD

## 2016-10-31 NOTE — Telephone Encounter (Signed)
Hi Robin,   I am sorry, but I never received a form.   I am ok with him stopping anticoagulation therapy 2 days prior to procedure.   Thanks,   [employer] International[employer]

## 2016-10-31 NOTE — Patient Instructions (Addendum)
You have been scheduled for a colonoscopy. Please follow written instructions given to you at your visit today.  Please pick up your prep supplies at the pharmacy within the next 1-3 days. If you use inhalers (even only as needed), please bring them with you on the day of your procedure. Your physician has requested that you go to www.startemmi.com and enter the access code given to you at your visit today. This web site gives a general overview about your procedure. However, you should still follow specific instructions given to you by our office regarding your preparation for the procedure.  You will be contaced by our office prior to your procedure for directions on holding your Xarelto.  If you do not hear from our office 1 week prior to your scheduled procedure, please call [phone] to discuss.

## 2016-11-03 NOTE — Progress Notes (Signed)
Agree with Ms. Zehr's management.  Carl E. Gessner, MD, FACG  

## 2016-11-05 ENCOUNTER — Other Ambulatory Visit: Payer: Self-pay | Admitting: Family Medicine

## 2016-11-08 ENCOUNTER — Encounter: Payer: Self-pay | Admitting: Adult Health

## 2016-11-13 ENCOUNTER — Encounter: Payer: Self-pay | Admitting: Internal Medicine

## 2016-11-13 ENCOUNTER — Other Ambulatory Visit: Payer: Self-pay

## 2016-11-13 ENCOUNTER — Telehealth: Payer: Self-pay

## 2016-11-13 ENCOUNTER — Ambulatory Visit (AMBULATORY_SURGERY_CENTER): Payer: Managed Care, Other (non HMO) | Admitting: Internal Medicine

## 2016-11-13 VITALS — BP 129/81 | HR 85 | Temp 98.6°F | Resp 21 | Ht 71.0 in | Wt 255.0 lb

## 2016-11-13 DIAGNOSIS — Z1212 Encounter for screening for malignant neoplasm of rectum: Secondary | ICD-10-CM | POA: Diagnosis not present

## 2016-11-13 DIAGNOSIS — Z1211 Encounter for screening for malignant neoplasm of colon: Secondary | ICD-10-CM

## 2016-11-13 MED ORDER — RIVAROXABAN 20 MG PO TABS: 20.0000 mg | ORAL_TABLET | Freq: Every day | ORAL | 4 refills | Status: DC

## 2016-11-13 MED ORDER — SODIUM CHLORIDE 0.9 % IV SOLN: 500.0000 mL | INTRAVENOUS | Status: DC

## 2016-11-13 NOTE — Op Note (Signed)
[facility] Patient Name: Joshua Castaneda Procedure Date: 11/13/2016 9:21 AM MRN: [id number] Endoscopist: Iva Boop , MD Age: 52 Referring MD:  Date of Birth: October 23, 1965 Gender: Male Account #: [id number] Procedure:                Colonoscopy Indications:              Screening for colorectal malignant neoplasm, This                            is the patient's first colonoscopy Medicines:                Propofol per Anesthesia, Monitored Anesthesia Care Procedure:                Pre-Anesthesia Assessment:                           - Prior to the procedure, a History and Physical                            was performed, and patient medications and                            allergies were reviewed. The patient's tolerance of                            previous anesthesia was also reviewed. The risks                            and benefits of the procedure and the sedation                            options and risks were discussed with the patient.                            All questions were answered, and informed consent                            was obtained. Prior Anticoagulants: The patient                            last took Xarelto (rivaroxaban) 2 days prior to the                            procedure. ASA Grade Assessment: II - A patient                            with mild systemic disease. After reviewing the                            risks and benefits, the patient was deemed in                            satisfactory condition to undergo the procedure.  After obtaining informed consent, the colonoscope                            was passed under direct vision. Throughout the                            procedure, the patient's blood pressure, pulse, and                            oxygen saturations were monitored continuously. The                            CF-HQ190 was introduced through the anus and   advanced to the the cecum, identified by                            appendiceal orifice and ileocecal valve. The                            colonoscopy was performed without difficulty. The                            patient tolerated the procedure well. The quality                            of the bowel preparation was excellent. The                            ileocecal valve, appendiceal orifice, and rectum                            were photographed. The quality of the bowel                            preparation was [Prep Quality]. The bowel                            preparation used was Miralax. Scope In: 9:27:39 AM Scope Out: 9:41:57 AM Scope Withdrawal Time: 0 hours 11 minutes 59 seconds  Total Procedure Duration: 0 hours 14 minutes 18 seconds  Findings:                 The perianal and digital rectal examinations were                            normal.                           The entire examined colon appeared normal on direct                            and retroflexion views. Complications:            No immediate complications. Estimated Blood Loss:     Estimated blood loss: none. Impression:               -  The entire examined colon is normal on direct and                            retroflexion views.                           - No specimens collected. Recommendation:           - Patient has a contact number available for                            emergencies. The signs and symptoms of potential                            delayed complications were discussed with the                            patient. Return to normal activities tomorrow.                            Written discharge instructions were provided to the                            patient.                           - Resume previous diet.                           - Continue present medications.                           - Resume Xarelto (rivaroxaban) at prior dose today.                           -  Repeat colonoscopy in 10 years for screening                            purposes. Iva Boop, MD 11/13/2016 10:21:00 AM This report has been signed electronically.

## 2016-11-13 NOTE — Telephone Encounter (Signed)
Received form from insurance company for PA on Xarelto. PA form filled out & faxed back.

## 2016-11-13 NOTE — Patient Instructions (Signed)
YOU HAD AN ENDOSCOPIC PROCEDURE TODAY AT THE [facility]:   Refer to the procedure report that was given to you for any specific questions about what was found during the examination.  If the procedure report does not answer your questions, please call your gastroenterologist to clarify.  If you requested that your care partner not be given the details of your procedure findings, then the procedure report has been included in a sealed envelope for you to review at your convenience later.  YOU SHOULD EXPECT: Some feelings of bloating in the abdomen. Passage of more gas than usual.  Walking can help get rid of the air that was put into your GI tract during the procedure and reduce the bloating. If you had a lower endoscopy (such as a colonoscopy or flexible sigmoidoscopy) you may notice spotting of blood in your stool or on the toilet paper. If you underwent a bowel prep for your procedure, you may not have a normal bowel movement for a few days.  Please Note:  You might notice some irritation and congestion in your nose or some drainage.  This is from the oxygen used during your procedure.  There is no need for concern and it should clear up in a day or so.  SYMPTOMS TO REPORT IMMEDIATELY:   Following lower endoscopy (colonoscopy or flexible sigmoidoscopy):  Excessive amounts of blood in the stool  Significant tenderness or worsening of abdominal pains  Swelling of the abdomen that is new, acute  Fever of 100F or higher  For urgent or emergent issues, a gastroenterologist can be reached at any hour by calling [phone].   DIET:  We do recommend a small meal at first, but then you may proceed to your regular diet.  Drink plenty of fluids but you should avoid alcoholic beverages for 24 hours.  ACTIVITY:  You should plan to take it easy for the rest of today and you should NOT DRIVE or use heavy machinery until tomorrow (because of the sedation medicines used during the test).     FOLLOW UP: Our staff will call the number listed on your records the next business day following your procedure to check on you and address any questions or concerns that you may have regarding the information given to you following your procedure. If we do not reach you, we will leave a message.  However, if you are feeling well and you are not experiencing any problems, there is no need to return our call.  We will assume that you have returned to your regular daily activities without incident.  If any biopsies were taken you will be contacted by phone or by letter within the next 1-3 weeks.  Please call us at [phone] if you have not heard about the biopsies in 3 weeks.   Xarelto resume today   SIGNATURES/CONFIDENTIALITY: You and/or your care partner have signed paperwork which will be entered into your electronic medical record.  These signatures attest to the fact that that the information above on your After Visit Summary has been reviewed and is understood.  Full responsibility of the confidentiality of this discharge information lies with you and/or your care-partner.

## 2016-11-14 ENCOUNTER — Telehealth: Payer: Self-pay | Admitting: *Deleted

## 2016-11-14 NOTE — Telephone Encounter (Signed)
Pa approved

## 2016-11-14 NOTE — Telephone Encounter (Signed)
Attempted to call home/mobile number, message states per the provider request, this number does not accept calls. Attempted to call work number and fast busy signal received.

## 2017-04-09 ENCOUNTER — Encounter: Payer: Self-pay | Admitting: Adult Health

## 2017-04-09 ENCOUNTER — Ambulatory Visit (INDEPENDENT_AMBULATORY_CARE_PROVIDER_SITE_OTHER): Payer: Managed Care, Other (non HMO) | Admitting: Adult Health

## 2017-04-09 ENCOUNTER — Other Ambulatory Visit: Payer: Self-pay | Admitting: Adult Health

## 2017-04-09 VITALS — BP 124/80 | Temp 98.3°F | Ht 71.0 in | Wt 231.6 lb

## 2017-04-09 DIAGNOSIS — E119 Type 2 diabetes mellitus without complications: Secondary | ICD-10-CM | POA: Diagnosis not present

## 2017-04-09 DIAGNOSIS — I10 Essential (primary) hypertension: Secondary | ICD-10-CM

## 2017-04-09 LAB — CBC WITH DIFFERENTIAL/PLATELET
BASOS PCT: 1.3 % (ref 0.0–3.0)
Basophils Absolute: 0.1 10*3/uL (ref 0.0–0.1)
EOS PCT: 2.8 % (ref 0.0–5.0)
Eosinophils Absolute: 0.1 10*3/uL (ref 0.0–0.7)
HCT: 37.7 % — ABNORMAL LOW (ref 39.0–52.0)
HEMOGLOBIN: 12.4 g/dL — AB (ref 13.0–17.0)
Lymphocytes Relative: 34.6 % (ref 12.0–46.0)
Lymphs Abs: 1.6 10*3/uL (ref 0.7–4.0)
MCHC: 33 g/dL (ref 30.0–36.0)
MCV: 81.9 fl (ref 78.0–100.0)
MONO ABS: 0.4 10*3/uL (ref 0.1–1.0)
Monocytes Relative: 9.6 % (ref 3.0–12.0)
NEUTROS ABS: 2.3 10*3/uL (ref 1.4–7.7)
Neutrophils Relative %: 51.7 % (ref 43.0–77.0)
Platelets: 360 10*3/uL (ref 150.0–400.0)
RBC: 4.61 Mil/uL (ref 4.22–5.81)
RDW: 14.1 % (ref 11.5–15.5)
WBC: 4.5 10*3/uL (ref 4.0–10.5)

## 2017-04-09 LAB — HEMOGLOBIN A1C: Hgb A1c MFr Bld: 14.8 % — ABNORMAL HIGH (ref 4.6–6.5)

## 2017-04-09 LAB — BASIC METABOLIC PANEL
BUN: 24 mg/dL — AB (ref 6–23)
CHLORIDE: 92 meq/L — AB (ref 96–112)
CO2: 27 meq/L (ref 19–32)
Calcium: 9.9 mg/dL (ref 8.4–10.5)
Creatinine, Ser: 1.6 mg/dL — ABNORMAL HIGH (ref 0.40–1.50)
GFR: 59.06 mL/min — ABNORMAL LOW (ref >60.00)
Glucose, Bld: 473 mg/dL — ABNORMAL HIGH (ref 70–99)
POTASSIUM: 4.4 meq/L (ref 3.5–5.1)
Sodium: 129 mEq/L — ABNORMAL LOW (ref 135–145)

## 2017-04-09 LAB — POCT GLYCOSYLATED HEMOGLOBIN (HGB A1C): HEMOGLOBIN A1C: 13.8

## 2017-04-09 MED ORDER — GLIPIZIDE 5 MG PO TABS
5.0000 mg | ORAL_TABLET | Freq: Two times a day (BID) | ORAL | 3 refills | Status: DC
Start: 2017-04-09 — End: 2017-12-03

## 2017-04-09 MED ORDER — METFORMIN HCL 500 MG PO TABS: 500.0000 mg | ORAL_TABLET | Freq: Two times a day (BID) | ORAL | 3 refills | Status: DC

## 2017-04-09 NOTE — Progress Notes (Signed)
Subjective:    Patient ID: Joshua Castaneda, male    DOB: [birth year], 51 y.o.   MRN: [id number]  HPI  51 year old male who  has a past medical history of Diabetes type 2, controlled (HCC); DVT (deep venous thrombosis) (HCC); Hematuria; Hypertension; Metabolic syndrome; and Pulmonary embolism (HCC). He presents to the office today for 6 month follow up regarding diabetes and hypertension  Currently diabetes is diet controlled. His last A1c in January 2018 was 6.2   Blood pressure was controlled during his last visit with Zestoretic 20-12.5 and amlodipine 10 mg   Today in the office he reports that over the last few months he has been eating poorly and has been drinking more alcohol. He has not been checking his blood sugar levels at home. He feels as though " all my energy has been taken from me." He also reports " I am peeing as much as I drink. It just goes in and comes out."    Wt Readings from Last 3 Encounters:  04/09/17 231 lb 9.6 oz (105.1 kg)  11/13/16 255 lb (115.7 kg)  10/31/16 255 lb 2 oz (115.7 kg)    Review of Systems  Constitutional: Positive for activity change and fatigue.  Respiratory: Negative.   Cardiovascular: Negative.   Gastrointestinal: Negative.   Endocrine: Positive for polydipsia and polyuria.  All other systems reviewed and are negative.  See HPI  Past Medical History:  Diagnosis Date  . Diabetes type 2, controlled (HCC)   . DVT (deep venous thrombosis) (HCC)    had LLE dvt in 1997; no clear provocation.   . Hematuria    neg work up with Urology.   . Hypertension   . Metabolic syndrome   . Pulmonary embolism [facility])     Social History   Social History  . Marital status: Single    Spouse name: N/A  . Number of children: 1  . Years of education: N/A   Occupational History  . [employer]    active the field.    Social History Main Topics  . Smoking status: Never Smoker  . Smokeless tobacco: Never Used  . Alcohol  use Yes     Comment: occasional wine  . Drug use: No  . Sexual activity: Not on file   Other Topics Concern  . Not on file   Social History Narrative   wortks with [employer]   [place]  Has wife and kids liv [place]     Past Surgical History:  Procedure Laterality Date  . NO PAST SURGERIES      Family History  Problem Relation Age of Onset  . Heart failure Father   . Clotting disorder Father   . Stroke Mother   . Aneurysm Mother        brain  . Hypertension Sister   . Diabetes Sister   . Hypertension Brother   . Diabetes Brother   . Colon cancer Neg Hx   . Esophageal cancer Neg Hx   . Rectal cancer Neg Hx     Allergies  Allergen Reactions  . Shellfish Allergy     REACTION: throat swells  . Shrimp Flavor     REACTION: throat swells    Current Outpatient Prescriptions on File Prior to Visit  Medication Sig Dispense Refill  . amLODipine (NORVASC) 5 MG tablet Take 1 tablet (5 mg total) by mouth daily. 90 tablet 3  . lisinopril-hydrochlorothiazide (ZESTORETIC)  20-12.5 MG tablet Take 1 tablet by mouth daily. 90 tablet 3  . rivaroxaban (XARELTO) 20 MG TABS tablet Take 1 tablet (20 mg total) by mouth daily. 90 tablet 4   No current facility-administered medications on file prior to visit.     BP 124/80 (BP Location: Left Arm, Patient Position: Sitting, Cuff Size: Normal)   Temp 98.3 F (36.8 C) (Oral)   Ht 5\' 11"  (1.803 m)   Wt 231 lb 9.6 oz (105.1 kg)   BMI 32.30 kg/m       Objective:   Physical Exam  Constitutional: He is oriented to person, place, and time. He appears well-developed and well-nourished. No distress.  Cardiovascular: Normal rate, regular rhythm, normal heart sounds and intact distal pulses.  Exam reveals no gallop and no friction rub.   No murmur heard. Pulmonary/Chest: Effort normal and breath sounds normal. No respiratory distress. He has no wheezes. He has no rales. He exhibits no tenderness.  Abdominal:  Soft. Bowel sounds are normal. He exhibits no distension and no mass. There is no tenderness. There is no rebound and no guarding.  Musculoskeletal: Normal range of motion. He exhibits no edema, tenderness or deformity.  Neurological: He is alert and oriented to person, place, and time.  Skin: Skin is warm and dry. No rash noted. He is not diaphoretic. No erythema. No pallor.  Psychiatric: He has a normal mood and affect. His behavior is normal. Judgment and thought content normal.  Nursing note and vitals reviewed.     Assessment & Plan:  1. Essential hypertension - Well controlled   2. Controlled type 2 diabetes mellitus without complication, without long-term current use of insulin (HCC) - POC HgB A1c- has increased from 6.2 to 13.8 - Hemoglobin A1c - Basic metabolic panel - CBC with Differential/Platelet - Will start on Metformin 500mg  BID  - Needs to get diet and exercise under control - Stop drinking alcohol  - Start monitoring blood sugars at home. He will call back with the name of his meter so that I can call in strips  - Follow up in 2 months or sooner if needed  Shirline Freesory Sheniece Ruggles, NP

## 2017-04-10 ENCOUNTER — Other Ambulatory Visit: Payer: Self-pay

## 2017-04-10 MED ORDER — GLUCOSE BLOOD VI STRP: ORAL_STRIP | 12 refills | Status: DC

## 2017-04-30 ENCOUNTER — Encounter: Payer: Self-pay | Admitting: Adult Health

## 2017-07-11 ENCOUNTER — Encounter: Payer: Self-pay | Admitting: Adult Health

## 2017-11-23 ENCOUNTER — Other Ambulatory Visit: Payer: Self-pay | Admitting: Family Medicine

## 2017-11-23 ENCOUNTER — Other Ambulatory Visit: Payer: Self-pay | Admitting: Adult Health

## 2017-11-23 DIAGNOSIS — I10 Essential (primary) hypertension: Secondary | ICD-10-CM

## 2017-11-25 NOTE — Telephone Encounter (Signed)
Left a message for a return call.  CRM created. 

## 2017-11-25 NOTE — Telephone Encounter (Signed)
#  90 sent to the pharmacy by e-scribe.  Pt now scheduled for 12/03/17 for cpx.

## 2017-12-02 ENCOUNTER — Telehealth: Payer: Self-pay | Admitting: *Deleted

## 2017-12-02 NOTE — Telephone Encounter (Signed)
A letter was received from [facility] stating Xarelto was not covered as they could not reach our office.  I do not recall receiving a request for this and I called [phone] to seek approval.  I spoke with a representative by the name of Innocent, he was given information on the pt and an approval was given for dates 11/04/2017-12/02/2018-code [id number].

## 2017-12-03 ENCOUNTER — Telehealth: Payer: Self-pay | Admitting: Adult Health

## 2017-12-03 ENCOUNTER — Ambulatory Visit (INDEPENDENT_AMBULATORY_CARE_PROVIDER_SITE_OTHER): Payer: Managed Care, Other (non HMO) | Admitting: Adult Health

## 2017-12-03 ENCOUNTER — Other Ambulatory Visit: Payer: Self-pay | Admitting: Adult Health

## 2017-12-03 ENCOUNTER — Encounter: Payer: Self-pay | Admitting: Adult Health

## 2017-12-03 VITALS — BP 140/80 | Temp 98.3°F | Ht 72.0 in | Wt 229.0 lb

## 2017-12-03 DIAGNOSIS — Z0000 Encounter for general adult medical examination without abnormal findings: Secondary | ICD-10-CM | POA: Diagnosis not present

## 2017-12-03 DIAGNOSIS — I10 Essential (primary) hypertension: Secondary | ICD-10-CM

## 2017-12-03 DIAGNOSIS — Z114 Encounter for screening for human immunodeficiency virus [HIV]: Secondary | ICD-10-CM

## 2017-12-03 DIAGNOSIS — E119 Type 2 diabetes mellitus without complications: Secondary | ICD-10-CM

## 2017-12-03 DIAGNOSIS — Z7901 Long term (current) use of anticoagulants: Secondary | ICD-10-CM

## 2017-12-03 DIAGNOSIS — Z23 Encounter for immunization: Secondary | ICD-10-CM | POA: Diagnosis not present

## 2017-12-03 DIAGNOSIS — Z125 Encounter for screening for malignant neoplasm of prostate: Secondary | ICD-10-CM

## 2017-12-03 LAB — CBC WITH DIFFERENTIAL/PLATELET
Basophils Absolute: 0.1 10*3/uL (ref 0.0–0.1)
Basophils Relative: 2 % (ref 0.0–3.0)
EOS PCT: 3.7 % (ref 0.0–5.0)
Eosinophils Absolute: 0.2 10*3/uL (ref 0.0–0.7)
HCT: 39.3 % (ref 39.0–52.0)
Hemoglobin: 13.2 g/dL (ref 13.0–17.0)
LYMPHS ABS: 1.9 10*3/uL (ref 0.7–4.0)
Lymphocytes Relative: 48.6 % — ABNORMAL HIGH (ref 12.0–46.0)
MCHC: 33.5 g/dL (ref 30.0–36.0)
MCV: 84.4 fl (ref 78.0–100.0)
MONOS PCT: 8.5 % (ref 3.0–12.0)
Monocytes Absolute: 0.3 10*3/uL (ref 0.1–1.0)
NEUTROS PCT: 37.2 % — AB (ref 43.0–77.0)
Neutro Abs: 1.5 10*3/uL (ref 1.4–7.7)
PLATELETS: 298 10*3/uL (ref 150.0–400.0)
RBC: 4.65 Mil/uL (ref 4.22–5.81)
RDW: 14.5 % (ref 11.5–15.5)
WBC: 4 10*3/uL (ref 4.0–10.5)

## 2017-12-03 LAB — HEPATIC FUNCTION PANEL
ALBUMIN: 4.2 g/dL (ref 3.5–5.2)
ALT: 14 U/L (ref 0–53)
AST: 13 U/L (ref 0–37)
Alkaline Phosphatase: 82 U/L (ref 39–117)
Bilirubin, Direct: 0.1 mg/dL (ref 0.0–0.3)
TOTAL PROTEIN: 7 g/dL (ref 6.0–8.3)
Total Bilirubin: 0.7 mg/dL (ref 0.2–1.2)

## 2017-12-03 LAB — LIPID PANEL
Cholesterol: 287 mg/dL — ABNORMAL HIGH (ref 0–200)
HDL: 61.7 mg/dL (ref >39.00)
NonHDL: 224.81
TRIGLYCERIDES: 261 mg/dL — AB (ref 0.0–149.0)
Total CHOL/HDL Ratio: 5
VLDL: 52.2 mg/dL — ABNORMAL HIGH (ref 0.0–40.0)

## 2017-12-03 LAB — BASIC METABOLIC PANEL
BUN: 21 mg/dL (ref 6–23)
CALCIUM: 9.1 mg/dL (ref 8.4–10.5)
CO2: 26 mEq/L (ref 19–32)
Chloride: 97 mEq/L (ref 96–112)
Creatinine, Ser: 1.35 mg/dL (ref 0.40–1.50)
GFR: 71.67 mL/min (ref >60.00)
GLUCOSE: 296 mg/dL — AB (ref 70–99)
Potassium: 3.7 mEq/L (ref 3.5–5.1)
SODIUM: 132 meq/L — AB (ref 135–145)

## 2017-12-03 LAB — POCT URINALYSIS DIPSTICK
BILIRUBIN UA: NEGATIVE
Leukocytes, UA: NEGATIVE
NITRITE UA: NEGATIVE
Odor: NEGATIVE
PH UA: 5 (ref 5.0–8.0)
PROTEIN UA: NEGATIVE
SPEC GRAV UA: 1.025 (ref 1.010–1.025)
UROBILINOGEN UA: 0.2 U/dL

## 2017-12-03 LAB — HEMOGLOBIN A1C: Hgb A1c MFr Bld: 13.8 % — ABNORMAL HIGH (ref 4.6–6.5)

## 2017-12-03 LAB — MICROALBUMIN / CREATININE URINE RATIO
CREATININE, U: 100.8 mg/dL
Microalb Creat Ratio: 2.3 mg/g (ref 0.0–30.0)
Microalb, Ur: 2.3 mg/dL — ABNORMAL HIGH (ref 0.0–1.9)

## 2017-12-03 LAB — LDL CHOLESTEROL, DIRECT: LDL DIRECT: 168 mg/dL

## 2017-12-03 LAB — PSA: PSA: 1.11 ng/mL (ref 0.10–4.00)

## 2017-12-03 LAB — TSH: TSH: 2.05 u[IU]/mL (ref 0.35–4.50)

## 2017-12-03 MED ORDER — GLIPIZIDE 10 MG PO TABS: 10.0000 mg | ORAL_TABLET | Freq: Two times a day (BID) | ORAL | 0 refills | Status: DC

## 2017-12-03 MED ORDER — ATORVASTATIN CALCIUM 10 MG PO TABS: 10.0000 mg | ORAL_TABLET | Freq: Every day | ORAL | 0 refills | Status: DC

## 2017-12-03 MED ORDER — AMLODIPINE BESYLATE 5 MG PO TABS: 5.0000 mg | ORAL_TABLET | Freq: Every day | ORAL | 3 refills | Status: DC

## 2017-12-03 MED ORDER — METFORMIN HCL 1000 MG PO TABS: 1000.0000 mg | ORAL_TABLET | Freq: Two times a day (BID) | ORAL | 0 refills | Status: DC

## 2017-12-03 NOTE — Patient Instructions (Signed)
It was great seeing you today   Please start exercising on a regular basis and eating a diabetic diet. Your diabetes is uncontrolled and this can cause major issues down the road  I will call you when I get your blood work back   Follow up in 3 months

## 2017-12-03 NOTE — Progress Notes (Signed)
Subjective:    Patient ID: Joshua Castaneda, male    DOB: 20-Nov-1965, 52 y.o.   MRN: [id number]  HPI  Patient presents for yearly preventative medicine examination. He is a pleasant 52 year old male who  has a past medical history of Diabetes type 2, controlled (HCC), DVT (deep venous thrombosis) (HCC), Hematuria, Hypertension, Metabolic syndrome, and Pulmonary embolism (HCC).  Due to history of hypertension he currently takes amlodipine 5 mg and lisinopril/hydrochlorothiazide 20-12.5 mg. Reports that he has not been taking Norvasc on a continuous basis   He does have a history of diabetes for which he takes metformin 500 mg twice daily glipizide 5 mg BID.  He does not monitor his blood sugars at home on a regular basis and has not been taking Metformin due to running out of a prescription  Lab Results  Component Value Date   HGBA1C 14.8 (H) 04/09/2017   Takes Plavix daily for history of pulmonary embolism  All immunizations and health maintenance protocols were reviewed with the patient and needed orders were placed. He is due for tdap. Does not want flu vaccination.   Appropriate screening laboratory values were ordered for the patient including screening of hyperlipidemia, renal function and hepatic function. If indicated by BPH, a PSA was ordered.  Medication reconciliation,  past medical history, social history, problem list and allergies were reviewed in detail with the patient  Goals were established with regard to weight loss, exercise, and  diet in compliance with medications. He does not exercise on a routine basis and is not eating healthy  Wt Readings from Last 3 Encounters:  12/03/17 229 lb (103.9 kg)  04/09/17 231 lb 9.6 oz (105.1 kg)  11/13/16 255 lb (115.7 kg)    End of life planning was discussed.  He is up-to-date on routine screening colonoscopy. He has had his diabetic exam at the end of 2018. He had his routine dental exam last week.   Review of Systems    Constitutional: Negative.   HENT: Negative.   Eyes: Negative.   Respiratory: Negative.   Cardiovascular: Negative.   Gastrointestinal: Negative.   Endocrine: Negative.   Genitourinary: Negative.   Musculoskeletal: Negative.   Skin: Negative.   Allergic/Immunologic: Negative.   Neurological: Negative.   Hematological: Negative.   Psychiatric/Behavioral: Negative.   All other systems reviewed and are negative.  Past Medical History:  Diagnosis Date  . Diabetes type 2, controlled (HCC)   . DVT (deep venous thrombosis) (HCC)    had LLE dvt in 1997; no clear provocation.   . Hematuria    neg work up with Urology.   . Hypertension   . Metabolic syndrome   . Pulmonary embolism [facility])     Social History   Socioeconomic History  . Marital status: Single    Spouse name: Not on file  . Number of children: 1  . Years of education: Not on file  . Highest education level: Not on file  Social Needs  . Financial resource strain: Not on file  . Food insecurity - worry: Not on file  . Food insecurity - inability: Not on file  . Transportation needs - medical: Not on file  . Transportation needs - non-medical: Not on file  Occupational History  . 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   Tobacco Use  . Smoking status: Never Smoker  . Smokeless tobacco: Never Used  Substance and Sexual Activity  .  Alcohol use: Yes    Comment: occasional wine  . Drug use: No  . Sexual activity: Not on file  Other Topics Concern  . Not on file  Social History Narrative   wortks with [employer]   [place]  Has wife and kids liv [place]     Past Surgical History:  Procedure Laterality Date  . NO PAST SURGERIES      Family History  Problem Relation Age of Onset  . Heart failure Father   . Clotting disorder Father   . Stroke Mother   . Aneurysm Mother        brain  . Hypertension Sister   . Diabetes  Sister   . Hypertension Brother   . Diabetes Brother   . Colon cancer Neg Hx   . Esophageal cancer Neg Hx   . Rectal cancer Neg Hx     Allergies  Allergen Reactions  . Shellfish Allergy     REACTION: throat swells  . Shrimp Flavor     REACTION: throat swells    Current Outpatient Medications on File Prior to Visit  Medication Sig Dispense Refill  . glipiZIDE (GLUCOTROL) 5 MG tablet Take 1 tablet (5 mg total) by mouth 2 (two) times daily before a meal. 60 tablet 3  . glucose blood (ACCU-CHEK AVIVA PLUS) test strip Use as instructed 100 each 12  . lisinopril-hydrochlorothiazide (PRINZIDE,ZESTORETIC) 20-12.5 MG tablet TAKE 1 TABLET DAILY 90 tablet 0  . XARELTO 20 MG TABS tablet TAKE 1 TABLET DAILY 90 tablet 4  . amLODipine (NORVASC) 5 MG tablet Take 1 tablet (5 mg total) by mouth daily. (Patient not taking: Reported on 12/03/2017) 90 tablet 3  . metFORMIN (GLUCOPHAGE) 500 MG tablet Take 1 tablet (500 mg total) by mouth 2 (two) times daily with a meal. (Patient not taking: Reported on 12/03/2017) 180 tablet 3   No current facility-administered medications on file prior to visit.     BP 140/80 (BP Location: Left Arm)   Temp 98.3 F (36.8 C) (Oral)   Ht 6' (1.829 m)   Wt 229 lb (103.9 kg)   BMI 31.06 kg/m       Objective:   Physical Exam  Constitutional: He is oriented to person, place, and time. He appears well-developed and well-nourished. No distress.  HENT:  Head: Normocephalic and atraumatic.  Right Ear: External ear normal.  Left Ear: External ear normal.  Nose: Nose normal.  Mouth/Throat: Oropharynx is clear and moist. No oropharyngeal exudate.  Eyes: Conjunctivae and EOM are normal. Pupils are equal, round, and reactive to light. Right eye exhibits no discharge. Left eye exhibits no discharge. No scleral icterus.  Neck: Normal range of motion. Neck supple. No JVD present. No tracheal deviation present. No thyromegaly present.  Cardiovascular: Normal rate, regular  rhythm, normal heart sounds and intact distal pulses. Exam reveals no gallop and no friction rub.  No murmur heard. Pulmonary/Chest: Effort normal and breath sounds normal. No stridor. No respiratory distress. He has no wheezes. He has no rales. He exhibits no tenderness.  Abdominal: Soft. Bowel sounds are normal. He exhibits no distension and no mass. There is no tenderness. There is no rebound and no guarding.  Musculoskeletal: Normal range of motion. He exhibits no edema, tenderness or deformity.  Lymphadenopathy:    He has no cervical adenopathy.  Neurological: He is alert and oriented to person, place, and time. He has normal reflexes. He displays normal reflexes. No cranial nerve  deficit. He exhibits normal muscle tone. Coordination normal.  Skin: Skin is warm and dry. No rash noted. He is not diaphoretic. No erythema. No pallor.  Psychiatric: He has a normal mood and affect. His behavior is normal. Judgment and thought content normal.  Nursing note and vitals reviewed.     Assessment & Plan:  1. Routine general medical examination at a health care facility - Follow up in one year for CPE  - Needs to work on diet and exercise  - Educated on the importance of getting diabetes under control.  - Basic metabolic panel - CBC with Differential/Platelet - Hemoglobin A1c - Hepatic function panel - Lipid panel - TSH  2. Essential hypertension - Take medications daily - Work on diet and exercise  - Basic metabolic panel - CBC with Differential/Platelet - Hemoglobin A1c - Hepatic function panel - Lipid panel - TSH - Microalbumin / creatinine urine ratio - POC Urinalysis Dipstick  3. Controlled type 2 diabetes mellitus without complication, without long-term current use of insulin (HCC) - Needs to work on diet and exercise  - Follow up in 3 months  - A1c likely high. Consider insulin and endocrine consult.  - Basic metabolic panel - CBC with Differential/Platelet - Hemoglobin  A1c - Hepatic function panel - Lipid panel - TSH - Microalbumin / creatinine urine ratio - POC Urinalysis Dipstick  4. Chronic anticoagulation - Continue with Plavix   5. Prostate cancer screening  - PSA  6. Encounter for screening for HIV  - HIV antibody  7. Need for Tdap vaccination  - Tdap vaccine greater than or equal to 7yo IM  Shirline Frees, NP

## 2017-12-03 NOTE — Telephone Encounter (Signed)
Spoke to patient and informed him of his labs. His A1 is 13.8. He does not want to go on insulin at this time ( I suggest he does). He would like to try getting back on oral medications and work on diet and exercise He agrees to come back in 3 months   He also needs to be on a statin for elevated cholesterol panel

## 2017-12-04 LAB — HIV ANTIBODY (ROUTINE TESTING W REFLEX): HIV: NONREACTIVE

## 2018-02-23 ENCOUNTER — Other Ambulatory Visit: Payer: Self-pay | Admitting: Adult Health

## 2018-02-23 DIAGNOSIS — I10 Essential (primary) hypertension: Secondary | ICD-10-CM

## 2018-02-24 NOTE — Telephone Encounter (Signed)
Due for A1C follow up 

## 2018-02-24 NOTE — Telephone Encounter (Signed)
Left a message for a return call.  CRM created. 

## 2018-02-25 NOTE — Telephone Encounter (Signed)
Pt now scheduled for follow up.  Medication sent to the pharmacy by e-scribe. 

## 2018-03-03 ENCOUNTER — Encounter: Payer: Self-pay | Admitting: Adult Health

## 2018-03-03 ENCOUNTER — Ambulatory Visit (INDEPENDENT_AMBULATORY_CARE_PROVIDER_SITE_OTHER): Payer: Managed Care, Other (non HMO) | Admitting: Adult Health

## 2018-03-03 VITALS — BP 136/90 | Temp 97.9°F | Wt 243.0 lb

## 2018-03-03 DIAGNOSIS — E1165 Type 2 diabetes mellitus with hyperglycemia: Secondary | ICD-10-CM

## 2018-03-03 LAB — POCT GLYCOSYLATED HEMOGLOBIN (HGB A1C): Hemoglobin A1C: 8.8

## 2018-03-03 NOTE — Progress Notes (Signed)
Subjective:    Patient ID: Joshua Castaneda, male    DOB: 12/26/1965, 52 y.o.   MRN: [id number]  HPI    52 year old male who  has a past medical history of Diabetes type 2, controlled (HCC), DVT (deep venous thrombosis) (HCC), Hematuria, Hypertension, Metabolic syndrome, and Pulmonary embolism (HCC). He presents to the office today for follow-up regarding diabetes.  During his last visit his A1c was 13.8.  At this time his diet was poor, he was not exercising and he was not taking metformin on a daily basis.  Recommended that he start insulin, but he refused.  Metformin was increased to 1000 mg twice daily and glipizide was increased to 10 mg.  He was advised to strongly consider working on lifestyle modifications.  Today in the office he reports that he is only taking Metformin 500 mg once daily. He has cut back on carbs and sugars.    Wt Readings from Last 3 Encounters:  03/03/18 243 lb (110.2 kg)  12/03/17 229 lb (103.9 kg)  04/09/17 231 lb 9.6 oz (105.1 kg)   Review of Systems See HPI  Past Medical History:  Diagnosis Date  . Diabetes type 2, controlled (HCC)   . DVT (deep venous thrombosis) (HCC)    had LLE dvt in 1997; no clear provocation.   . Hematuria    neg work up with Urology.   . Hypertension   . Metabolic syndrome   . Pulmonary embolism [facility])     Social History   Socioeconomic History  . Marital status: Single    Spouse name: Not on file  . Number of children: 1  . Years of education: Not on file  . Highest education level: Not on file  Occupational History  . 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   Social Needs  . Financial resource strain: Not on file  . Food insecurity:    Worry: Not on file    Inability: Not on file  . Transportation needs:    Medical: Not on file    Non-medical: Not on file  Tobacco Use  . Smoking status: Never Smoker  . Smokeless tobacco: Never Used  Substance and Sexual  Activity  . Alcohol use: Yes    Comment: occasional wine  . Drug use: No  . Sexual activity: Not on file  Lifestyle  . Physical activity:    Days per week: Not on file    Minutes per session: Not on file  . Stress: Not on file  Relationships  . Social connections:    Talks on phone: Not on file    Gets together: Not on file    Attends religious service: Not on file    Active member of club or organization: Not on file    Attends meetings of clubs or organizations: Not on file    Relationship status: Not on file  . Intimate partner violence:    Fear of current or ex partner: Not on file    Emotionally abused: Not on file    Physically abused: Not on file    Forced sexual activity: Not on file  Other Topics Concern  . Not on file  Social History Narrative   wortks with [employer]   [place]  Has wife and kids liv [place]     Past Surgical History:  Procedure Laterality Date  . NO PAST SURGERIES  Family History  Problem Relation Age of Onset  . Heart failure Father   . Clotting disorder Father   . Stroke Mother   . Aneurysm Mother        brain  . Hypertension Sister   . Diabetes Sister   . Hypertension Brother   . Diabetes Brother   . Colon cancer Neg Hx   . Esophageal cancer Neg Hx   . Rectal cancer Neg Hx     Allergies  Allergen Reactions  . Shellfish Allergy     REACTION: throat swells  . Shrimp Flavor     REACTION: throat swells    Current Outpatient Medications on File Prior to Visit  Medication Sig Dispense Refill  . amLODipine (NORVASC) 5 MG tablet Take 1 tablet (5 mg total) by mouth daily. 90 tablet 3  . glipiZIDE (GLUCOTROL) 10 MG tablet TAKE 1 TABLET TWICE A DAY BEFORE MEALS 180 tablet 0  . glucose blood (ACCU-CHEK AVIVA PLUS) test strip Use as instructed 100 each 12  . lisinopril-hydrochlorothiazide (PRINZIDE,ZESTORETIC) 20-12.5 MG tablet TAKE 1 TABLET DAILY 90 tablet 2  . metFORMIN (GLUCOPHAGE) 1000 MG  tablet Take 1 tablet (1,000 mg total) by mouth 2 (two) times daily with a meal. 180 tablet 0  . XARELTO 20 MG TABS tablet TAKE 1 TABLET DAILY 90 tablet 4  . atorvastatin (LIPITOR) 10 MG tablet Take 1 tablet (10 mg total) by mouth daily. (Patient not taking: Reported on 03/03/2018) 90 tablet 0   No current facility-administered medications on file prior to visit.     BP 136/90   Temp 97.9 F (36.6 C) (Oral)   Wt 243 lb (110.2 kg)   BMI 32.96 kg/m       Objective:   Physical Exam  Constitutional: He appears well-developed and well-nourished. No distress.  Neck: Normal range of motion. Neck supple.  Cardiovascular: Normal rate, regular rhythm, normal heart sounds and intact distal pulses. Exam reveals no gallop and no friction rub.  No murmur heard. Pulmonary/Chest: Effort normal and breath sounds normal. No stridor. No respiratory distress. He has no wheezes. He has no rales. He exhibits no tenderness.  Skin: Skin is warm and dry. Capillary refill takes less than 2 seconds. He is not diaphoretic.  Psychiatric: He has a normal mood and affect. His behavior is normal. Judgment and thought content normal.  Nursing note and vitals reviewed.      Assessment & Plan:  1. Uncontrolled type 2 diabetes mellitus with hyperglycemia (HCC) - POC HgB A1c - 8.8 - has improved.  - Weight is up  - Encouraged to increase Metformin to 1000 mg BID  - Continue to work on diet modifications  - Needs to lose weight  - Follow up in 3 months or sooner if needed  Shirline Frees, NP

## 2019-01-19 ENCOUNTER — Other Ambulatory Visit: Payer: Self-pay | Admitting: Adult Health

## 2019-01-19 DIAGNOSIS — I10 Essential (primary) hypertension: Secondary | ICD-10-CM

## 2019-01-20 ENCOUNTER — Other Ambulatory Visit: Payer: Self-pay | Admitting: Family Medicine

## 2019-01-20 DIAGNOSIS — E1165 Type 2 diabetes mellitus with hyperglycemia: Secondary | ICD-10-CM

## 2019-01-20 NOTE — Telephone Encounter (Signed)
Doesn't look like pt is taking medication as directed.  Would you like to get an A1C and then webex?

## 2019-01-20 NOTE — Telephone Encounter (Signed)
Yes please. Ok to refill for 90 days

## 2019-01-20 NOTE — Telephone Encounter (Signed)
Sent to the pharmacy by e-scribe.  Pt now scheduled for labs on 01/27/2019 and will get set up for virtual visit.

## 2019-01-27 ENCOUNTER — Other Ambulatory Visit: Payer: Self-pay

## 2019-01-27 ENCOUNTER — Other Ambulatory Visit (INDEPENDENT_AMBULATORY_CARE_PROVIDER_SITE_OTHER): Payer: BLUE CROSS/BLUE SHIELD

## 2019-01-27 ENCOUNTER — Other Ambulatory Visit: Payer: Self-pay | Admitting: Family Medicine

## 2019-01-27 ENCOUNTER — Other Ambulatory Visit: Payer: Managed Care, Other (non HMO)

## 2019-01-27 DIAGNOSIS — E1165 Type 2 diabetes mellitus with hyperglycemia: Secondary | ICD-10-CM | POA: Diagnosis not present

## 2019-01-27 MED ORDER — RIVAROXABAN 20 MG PO TABS: 20.0000 mg | ORAL_TABLET | Freq: Every day | ORAL | 0 refills | Status: DC

## 2019-01-27 NOTE — Telephone Encounter (Signed)
Ok to refill for one year  

## 2019-01-27 NOTE — Telephone Encounter (Signed)
Pt states he is completely out of medication.  I do not see that Kandee Keen has filled in the past.

## 2019-01-28 ENCOUNTER — Ambulatory Visit (INDEPENDENT_AMBULATORY_CARE_PROVIDER_SITE_OTHER): Payer: BLUE CROSS/BLUE SHIELD | Admitting: Adult Health

## 2019-01-28 ENCOUNTER — Other Ambulatory Visit: Payer: Self-pay

## 2019-01-28 ENCOUNTER — Encounter: Payer: Self-pay | Admitting: Adult Health

## 2019-01-28 ENCOUNTER — Telehealth: Payer: Self-pay | Admitting: *Deleted

## 2019-01-28 DIAGNOSIS — E118 Type 2 diabetes mellitus with unspecified complications: Secondary | ICD-10-CM | POA: Diagnosis not present

## 2019-01-28 DIAGNOSIS — Z794 Long term (current) use of insulin: Secondary | ICD-10-CM

## 2019-01-28 LAB — HEMOGLOBIN A1C: Hgb A1c MFr Bld: >14 % of total Hgb — ABNORMAL HIGH (ref <5.7)

## 2019-01-28 NOTE — Telephone Encounter (Signed)
Spoke to the pt and advised that he will receive a test message at the time of his appointment.  Nothing further needed.

## 2019-01-28 NOTE — Progress Notes (Signed)
Virtual Visit via Video Note  I connected with Joshua Castaneda on 01/28/19 at  1:30 PM EDT by a video enabled telemedicine application and verified that I am speaking with the correct person using two identifiers.  Location patient: home Location provider:work or home office Persons participating in the virtual visit: patient, provider  I discussed the limitations of evaluation and management by telemedicine and the availability of in person appointments. The patient expressed understanding and agreed to proceed.   HPI: 53 year old male who is being evaluated today for follow-up regarding diabetes.  He was last seen a year ago in May 2019.  At this time his A1c had dropped from 13.8 to 8.8.  He was working on diet at this time but really was not exercising.  He is currently prescribed glipizide 10 mg twice a day before meals as well as metformin 1000 mg twice daily.  Past he has been inconsistent with lifestyle modifications as well as medication compliance.  Today in the office he reports that he has not been taking his medication as directed. He either doesn't take his medication or alternated which pills he takes, this has been happening since November/December. He is not checking his blood sugars at home. He does report that he is walking and has been able to lose weight.  His diet is suffering. He does report polyuria.    ROS: See pertinent positives and negatives per HPI.  Past Medical History:  Diagnosis Date  . Diabetes type 2, controlled (HCC)   . DVT (deep venous thrombosis) (HCC)    had LLE dvt in 1997; no clear provocation.   . Hematuria    neg work up with Urology.   . Hypertension   . Metabolic syndrome   . Pulmonary embolism [facility])     Past Surgical History:  Procedure Laterality Date  . NO PAST SURGERIES      Family History  Problem Relation Age of Onset  . Heart failure Father   . Clotting disorder Father   . Stroke Mother   . Aneurysm Mother        brain  .  Hypertension Sister   . Diabetes Sister   . Hypertension Brother   . Diabetes Brother   . Colon cancer Neg Hx   . Esophageal cancer Neg Hx   . Rectal cancer Neg Hx        Current Outpatient Medications:  .  amLODipine (NORVASC) 5 MG tablet, TAKE 1 TABLET DAILY, Disp: 90 tablet, Rfl: 0 .  atorvastatin (LIPITOR) 10 MG tablet, Take 1 tablet (10 mg total) by mouth daily. (Patient not taking: Reported on 03/03/2018), Disp: 90 tablet, Rfl: 0 .  glipiZIDE (GLUCOTROL) 10 MG tablet, TAKE 1 TABLET TWICE A DAY BEFORE MEALS, Disp: 180 tablet, Rfl: 0 .  glucose blood (ACCU-CHEK AVIVA PLUS) test strip, Use as instructed, Disp: 100 each, Rfl: 12 .  lisinopril-hydrochlorothiazide (PRINZIDE,ZESTORETIC) 20-12.5 MG tablet, TAKE 1 TABLET DAILY, Disp: 90 tablet, Rfl: 0 .  metFORMIN (GLUCOPHAGE) 1000 MG tablet, TAKE 1 TABLET TWICE A DAY WITH MEALS, Disp: 180 tablet, Rfl: 0 .  rivaroxaban (XARELTO) 20 MG TABS tablet, Take 1 tablet (20 mg total) by mouth daily., Disp: 90 tablet, Rfl: 0  EXAM:  VITALS per patient if applicable:  GENERAL: alert, oriented, appears well and in no acute distress  HEENT: atraumatic, conjunttiva clear, no obvious abnormalities on inspection of external nose and ears  NECK: normal movements of the head and neck  LUNGS: on inspection no  signs of respiratory distress, breathing rate appears normal, no obvious gross SOB, gasping or wheezing  CV: no obvious cyanosis  MS: moves all visible extremities without noticeable abnormality  PSYCH/NEURO: pleasant and cooperative, no obvious depression or anxiety, speech and thought processing grossly intact  ASSESSMENT AND PLAN:  Discussed the following assessment and plan:  Controlled type 2 diabetes mellitus with complication, with long-term current use of insulin (HCC)  - A1c >14 - Reviewed complications from diabetes. I recommended either adding Lantus or Trulicity. He refused at this time and would like to wait 3 months. He  understands the long term effects of uncontrolled diabetes including premature death. Needs to take his medication as directed and I want him to start monitoring his blood sugars TID. He has strips at home   I discussed the assessment and treatment plan with the patient. The patient was provided an opportunity to ask questions and all were answered. The patient agreed with the plan and demonstrated an understanding of the instructions.   The patient was advised to call back or seek an in-person evaluation if the symptoms worsen or if the condition fails to improve as anticipated.   Shirline Freesory Angelita Harnack, NP

## 2019-01-28 NOTE — Telephone Encounter (Signed)
Copied from CRM [id number]. Topic: General - Call Back - No Documentation >> Jan 28, 2019  8:41 AM Angela Nevin wrote: Reason for CRM: Patient returning call to [facility]. He also states that he never received link for webex today.

## 2019-01-29 ENCOUNTER — Encounter: Payer: Self-pay | Admitting: Adult Health

## 2019-02-01 ENCOUNTER — Encounter: Payer: Self-pay | Admitting: Adult Health

## 2019-02-02 ENCOUNTER — Encounter: Payer: Self-pay | Admitting: Adult Health

## 2019-02-04 ENCOUNTER — Encounter: Payer: Self-pay | Admitting: Adult Health

## 2019-02-05 ENCOUNTER — Encounter: Payer: Self-pay | Admitting: Adult Health

## 2019-02-05 NOTE — Telephone Encounter (Signed)
Prior auth sent to [email or web address].  Note received states: Case Id:[id number];Status:Approved;Review Type:Prior Auth;Coverage Start Date:01/06/2019;Coverage End Date:02/05/2020.  Patient informed via Mychart message.

## 2019-05-03 ENCOUNTER — Other Ambulatory Visit: Payer: Self-pay | Admitting: Adult Health

## 2019-05-05 ENCOUNTER — Telehealth: Payer: Self-pay | Admitting: Adult Health

## 2019-05-05 ENCOUNTER — Encounter: Payer: Self-pay | Admitting: Adult Health

## 2019-05-05 NOTE — Telephone Encounter (Signed)
Medication was sent to the pharmacy earlier today for a 30 day supply.  Pt is past due for cpx and will need to be scheduled.  MyChart message was returned to the pt informing him to call office and schedule.  Nothing further needed at this time.

## 2019-05-05 NOTE — Telephone Encounter (Signed)
Medication Refill - Medication: XARELTO 20 MG TABS tablet [Pharmacy Med Name: XARELTO 20MG  TABLETS (Pharamcy called requesting 90day supply for patient)   Has the patient contacted their pharmacy? yES (Agent: If no, request that the patient contact the pharmacy for the refill.) (Agent: If yes, when and what did the pharmacy advise?)Contact PCP  Preferred Pharmacy (with phone number or street name):  [id number] - [place], [place] [address] AT [address] [phone] (Phone) [phone] (Fax)     Agent: Please be advised that RX refills may take up to 3 business days. We ask that you follow-up with your pharmacy.

## 2019-05-05 NOTE — Telephone Encounter (Signed)
Sent to the pharmacy by e-scribe for 30 days.  Pt is past due for cpx and lab work.

## 2019-06-03 ENCOUNTER — Other Ambulatory Visit: Payer: Self-pay | Admitting: Adult Health

## 2019-07-01 ENCOUNTER — Other Ambulatory Visit: Payer: Self-pay | Admitting: Adult Health

## 2019-07-01 DIAGNOSIS — I10 Essential (primary) hypertension: Secondary | ICD-10-CM

## 2019-07-02 ENCOUNTER — Other Ambulatory Visit: Payer: Self-pay | Admitting: Family Medicine

## 2019-07-02 DIAGNOSIS — I10 Essential (primary) hypertension: Secondary | ICD-10-CM

## 2019-07-02 MED ORDER — AMLODIPINE BESYLATE 5 MG PO TABS: 5.0000 mg | ORAL_TABLET | Freq: Every day | ORAL | 0 refills | Status: DC

## 2019-07-02 MED ORDER — LISINOPRIL-HYDROCHLOROTHIAZIDE 20-12.5 MG PO TABS: 1.0000 | ORAL_TABLET | Freq: Every day | ORAL | 0 refills | Status: DC

## 2019-07-02 MED ORDER — ATORVASTATIN CALCIUM 10 MG PO TABS: 10.0000 mg | ORAL_TABLET | Freq: Every day | ORAL | 0 refills | Status: DC

## 2019-07-02 MED ORDER — METFORMIN HCL 1000 MG PO TABS: 1000.0000 mg | ORAL_TABLET | Freq: Two times a day (BID) | ORAL | 0 refills | Status: DC

## 2019-07-02 MED ORDER — RIVAROXABAN 20 MG PO TABS: ORAL_TABLET | ORAL | 0 refills | Status: DC

## 2019-07-02 MED ORDER — GLIPIZIDE 10 MG PO TABS: ORAL_TABLET | ORAL | 0 refills | Status: DC

## 2019-07-02 NOTE — Telephone Encounter (Signed)
Sent to the pharmacy by e-scribe.  Pt has made appt for cpx.

## 2019-07-06 ENCOUNTER — Other Ambulatory Visit: Payer: Self-pay | Admitting: Adult Health

## 2019-07-08 ENCOUNTER — Ambulatory Visit: Payer: BLUE CROSS/BLUE SHIELD | Admitting: Adult Health

## 2019-09-03 ENCOUNTER — Ambulatory Visit (INDEPENDENT_AMBULATORY_CARE_PROVIDER_SITE_OTHER): Payer: Self-pay | Admitting: Adult Health

## 2019-09-03 DIAGNOSIS — Z5329 Procedure and treatment not carried out because of patient's decision for other reasons: Secondary | ICD-10-CM

## 2019-09-03 NOTE — Progress Notes (Deleted)
Subjective:    Patient ID: Joshua Castaneda, male    DOB: Oct 27, 1965, 53 y.o.   MRN: [id number]  HPI Patient presents for yearly preventative medicine examination. He is a pleasant 53 year old male who  has a past medical history of Diabetes type 2, controlled ([place]), DVT (deep venous thrombosis) ([place]), Hematuria, Hypertension, Metabolic syndrome, and Pulmonary embolism ([place]).  Essential Hypertension -currently prescribed Norvasc 5 mg and lisinopril/hydrochlorothiazide 20-12.5 mg.  He denies dizziness, lightheadedness, chest pain, shortness of breath, muscle cramping, or syncopal episodes. BP Readings from Last 3 Encounters:  03/03/18 136/90  12/03/17 140/80  04/09/17 124/80   DM II-he is currently prescribed Metformin 1000 mg twice daily and glipizide 10 mg twice daily.  During his last visit in April his A1c had increased from 8.8 to greater than 14.  At this time he reported that he was not taking his medications and he was not checking his blood sugars at home.  He was performing no type of exercise and his diet was far from ideal. Lab Results  Component Value Date   HGBA1C >14.0 (H) 01/27/2019    Hyperlipidemia -takes atorvastatin 10 mg daily.  He denies fatigue or myalgia Lab Results  Component Value Date   CHOL 287 (H) 12/03/2017   HDL 61.70 12/03/2017   LDLCALC 170 (H) 10/17/2016   LDLDIRECT 168.0 12/03/2017   TRIG 261.0 (H) 12/03/2017   CHOLHDL 5 12/03/2017    H/O DVT and PE -he is on life time anticoagulation with Xarelto   All immunizations and health maintenance protocols were reviewed with the patient and needed orders were placed.  He is due for yearly influenza vaccination as well as Prevnar 13  Appropriate screening laboratory values were ordered for the patient including screening of hyperlipidemia, renal function and hepatic function. If indicated by BPH, a PSA was ordered.  Medication reconciliation,  past medical history, social history, problem list and  allergies were reviewed in detail with the patient  Goals were established with regard to weight loss, exercise, and  diet in compliance with medications  End of life planning was discussed.  He is up-to-date on routine screening colonoscopy   Review of Systems  Constitutional: Negative.   HENT: Negative.   Eyes: Negative.   Respiratory: Negative.   Cardiovascular: Negative.   Gastrointestinal: Negative.   Endocrine: Negative.   Genitourinary: Negative.   Musculoskeletal: Negative.   Skin: Negative.   Allergic/Immunologic: Negative.   Neurological: Negative.   Hematological: Negative.   Psychiatric/Behavioral: Negative.   All other systems reviewed and are negative.  Past Medical History:  Diagnosis Date  . Diabetes type 2, controlled ([place])   . DVT (deep venous thrombosis) (HCC)    had LLE dvt in 1997; no clear provocation.   . Hematuria    neg work up with Urology.   . Hypertension   . Metabolic syndrome   . Pulmonary embolism [facility])     Social History   Socioeconomic History  . Marital status: Married    Spouse name: Not on file  . Number of children: 1  . Years of education: Not on file  . Highest education level: Not on file  Occupational History  . 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   Social Needs  . Financial resource strain: Not on file  . Food insecurity    Worry: Not on file    Inability: Not on file  . Transportation needs  Medical: Not on file    Non-medical: Not on file  Tobacco Use  . Smoking status: Never Smoker  . Smokeless tobacco: Never Used  Substance and Sexual Activity  . Alcohol use: Yes    Comment: occasional wine  . Drug use: No  . Sexual activity: Not on file  Lifestyle  . Physical activity    Days per week: Not on file    Minutes per session: Not on file  . Stress: Not on file  Relationships  . Social Musician on phone: Not on file    Gets together:  Not on file    Attends religious service: Not on file    Active member of club or organization: Not on file    Attends meetings of clubs or organizations: Not on file    Relationship status: Not on file  . Intimate partner violence    Fear of current or ex partner: Not on file    Emotionally abused: Not on file    Physically abused: Not on file    Forced sexual activity: Not on file  Other Topics Concern  . Not on file  Social History Narrative   wortks with [employer]   [place]  Has wife and kids liv [place]     Past Surgical History:  Procedure Laterality Date  . NO PAST SURGERIES      Family History  Problem Relation Age of Onset  . Heart failure Father   . Clotting disorder Father   . Stroke Mother   . Aneurysm Mother        brain  . Hypertension Sister   . Diabetes Sister   . Hypertension Brother   . Diabetes Brother   . Colon cancer Neg Hx   . Esophageal cancer Neg Hx   . Rectal cancer Neg Hx     Allergies  Allergen Reactions  . Shellfish Allergy     REACTION: throat swells  . Shrimp Flavor     REACTION: throat swells    Current Outpatient Medications on File Prior to Visit  Medication Sig Dispense Refill  . amLODipine (NORVASC) 5 MG tablet Take 1 tablet (5 mg total) by mouth daily. 90 tablet 0  . atorvastatin (LIPITOR) 10 MG tablet Take 1 tablet (10 mg total) by mouth daily. 90 tablet 0  . glipiZIDE (GLUCOTROL) 10 MG tablet TAKE 1 TABLET TWICE A DAY BEFORE MEALS 180 tablet 0  . glucose blood (ACCU-CHEK AVIVA PLUS) test strip Use as instructed 100 each 12  . lisinopril-hydrochlorothiazide (ZESTORETIC) 20-12.5 MG tablet Take 1 tablet by mouth daily. 90 tablet 0  . metFORMIN (GLUCOPHAGE) 1000 MG tablet Take 1 tablet (1,000 mg total) by mouth 2 (two) times daily with a meal. 180 tablet 0  . XARELTO 20 MG TABS tablet TAKE 1 TABLET(20 MG) BY MOUTH DAILY 30 tablet 2   No current facility-administered medications on file prior  to visit.     There were no vitals taken for this visit.      Objective:   Physical Exam Vitals signs and nursing note reviewed.  Constitutional:      General: He is not in acute distress.    Appearance: Normal appearance. He is not diaphoretic.  HENT:     Head: Normocephalic and atraumatic.     Right Ear: Tympanic membrane, ear canal and external ear normal. There is no impacted cerumen.     Left Ear: Tympanic membrane,  ear canal and external ear normal. There is no impacted cerumen.     Nose: Nose normal. No congestion or rhinorrhea.     Mouth/Throat:     Mouth: Mucous membranes are moist.     Pharynx: Oropharynx is clear. No oropharyngeal exudate.  Eyes:     General: No scleral icterus.       Right eye: No discharge.        Left eye: No discharge.     Conjunctiva/sclera: Conjunctivae normal.     Pupils: Pupils are equal, round, and reactive to light.  Neck:     Musculoskeletal: Normal range of motion and neck supple.     Thyroid: No thyromegaly.     Vascular: No JVD.     Trachea: No tracheal deviation.  Cardiovascular:     Rate and Rhythm: Normal rate and regular rhythm.     Pulses: Normal pulses.     Heart sounds: Normal heart sounds. No murmur. No friction rub. No gallop.   Pulmonary:     Effort: Pulmonary effort is normal. No respiratory distress.     Breath sounds: Normal breath sounds. No stridor. No wheezing, rhonchi or rales.  Chest:     Chest wall: No tenderness.  Abdominal:     General: Bowel sounds are normal. There is no distension.     Palpations: Abdomen is soft. There is no mass.     Tenderness: There is no abdominal tenderness. There is no right CVA tenderness, left CVA tenderness, guarding or rebound.     Hernia: No hernia is present.  Musculoskeletal: Normal range of motion.        General: No swelling, tenderness, deformity or signs of injury.     Right lower leg: No edema.     Left lower leg: No edema.  Lymphadenopathy:     Cervical: No  cervical adenopathy.  Skin:    General: Skin is warm and dry.     Capillary Refill: Capillary refill takes less than 2 seconds.     Coloration: Skin is not jaundiced or pale.     Findings: No bruising, erythema, lesion or rash.  Neurological:     General: No focal deficit present.     Mental Status: He is alert and oriented to person, place, and time. Mental status is at baseline.     Cranial Nerves: No cranial nerve deficit.     Sensory: No sensory deficit.     Motor: No weakness or abnormal muscle tone.     Coordination: Coordination normal.     Gait: Gait normal.     Deep Tendon Reflexes: Reflexes are normal and symmetric. Reflexes normal.  Psychiatric:        Mood and Affect: Mood normal.        Behavior: Behavior normal.        Thought Content: Thought content normal.        Judgment: Judgment normal.        Assessment & Plan:

## 2019-09-27 ENCOUNTER — Other Ambulatory Visit: Payer: Self-pay | Admitting: Adult Health

## 2019-09-27 DIAGNOSIS — I10 Essential (primary) hypertension: Secondary | ICD-10-CM

## 2019-09-29 NOTE — Telephone Encounter (Signed)
30 day

## 2019-09-29 NOTE — Telephone Encounter (Signed)
Pt scheduled for CPX in Feb.  Does he need A1C before then?

## 2019-09-29 NOTE — Telephone Encounter (Signed)
Yes please

## 2019-09-29 NOTE — Telephone Encounter (Signed)
30 day supply sent to the pharmacy by e-scribe. 

## 2019-10-12 ENCOUNTER — Other Ambulatory Visit: Payer: Self-pay

## 2019-10-13 ENCOUNTER — Encounter: Payer: Self-pay | Admitting: Adult Health

## 2019-10-13 ENCOUNTER — Ambulatory Visit (INDEPENDENT_AMBULATORY_CARE_PROVIDER_SITE_OTHER): Payer: BC Managed Care – PPO | Admitting: Adult Health

## 2019-10-13 VITALS — BP 138/76 | Temp 97.8°F | Wt 234.0 lb

## 2019-10-13 DIAGNOSIS — Z794 Long term (current) use of insulin: Secondary | ICD-10-CM | POA: Diagnosis not present

## 2019-10-13 DIAGNOSIS — E118 Type 2 diabetes mellitus with unspecified complications: Secondary | ICD-10-CM

## 2019-10-13 LAB — POCT GLYCOSYLATED HEMOGLOBIN (HGB A1C): HbA1c, POC (controlled diabetic range): 10.8 % — AB (ref 0.0–7.0)

## 2019-10-13 MED ORDER — RIVAROXABAN 20 MG PO TABS: ORAL_TABLET | ORAL | 0 refills | Status: DC

## 2019-10-13 NOTE — Progress Notes (Signed)
Subjective:    Patient ID: Joshua Castaneda, male    DOB: July 29, 1966, 53 y.o.   MRN: [id number]  HPI 53 year old male who  has a past medical history of Diabetes type 2, controlled ([place]), DVT (deep venous thrombosis) ([place]), Hematuria, Hypertension, Metabolic syndrome, and Pulmonary embolism ([place]).  He presents to the office today for follow up regarding diabetes mellitus. He is currently prescribed glipizide 10 mg BID and Metformin 1000 mg BID. He has been very inconsistent in the past with diet and medication adherence.   His last A1c was >14 in April 2020. - at this time he was not taking his medications as directed. He either did not take them or alternated which pulls he takes. He was not checking his blood sugar at home. It was recommended adding Lantus or Trulicity - but he refused.   Today he reports that he has been walking routinely over the last few weeks. He has not been eating healthy and he has not been taking his medications as directed " sometimes I will just split the pill in half". He feels as though sometimes when he takes full pills of his diabetes medications that his fingers will become numb   Lab Results  Component Value Date   HGBA1C 10.8 (A) 10/13/2019    Review of Systems See HPI   Past Medical History:  Diagnosis Date  . Diabetes type 2, controlled ([place])   . DVT (deep venous thrombosis) (HCC)    had LLE dvt in 1997; no clear provocation.   . Hematuria    neg work up with Urology.   . Hypertension   . Metabolic syndrome   . Pulmonary embolism [facility])     Social History   Socioeconomic History  . Marital status: Married    Spouse name: Not on file  . Number of children: 1  . Years of education: Not on file  . Highest education level: Not on file  Occupational History  . 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   Tobacco Use  . Smoking status: Never Smoker  . Smokeless tobacco: Never Used  Substance  and Sexual Activity  . Alcohol use: Yes    Comment: occasional wine  . Drug use: No  . Sexual activity: Not on file  Other Topics Concern  . 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 Difficulty of Paying Living Expenses: Not on file  Food Insecurity:   . Worried About Charity fundraiser in the Last Year: Not on file  . Ran Out of Food in the Last Year: Not on file  Transportation Needs:   . Lack of Transportation (Medical): Not on file  . Lack of Transportation (Non-Medical): Not on file  Physical Activity:   . Days of Exercise per Week: Not on file  . Minutes of Exercise per Session: Not on file  Stress:   . Feeling of Stress : Not on file  Social Connections:   . Frequency of Communication with Friends and Family: Not on file  . Frequency of Social Gatherings with Friends and Family: Not on file  . Attends Religious Services: Not on file  . Active Member of Clubs or Organizations: Not on file  .  Attends BankerClub or Organization Meetings: Not on file  . Marital Status: Not on file  Intimate Partner Violence:   . Fear of Current or Ex-Partner: Not on file  . Emotionally Abused: Not on file  . Physically Abused: Not on file  . Sexually Abused: Not on file    Past Surgical History:  Procedure Laterality Date  . NO PAST SURGERIES      Family History  Problem Relation Age of Onset  . Heart failure Father   . Clotting disorder Father   . Stroke Mother   . Aneurysm Mother        brain  . Hypertension Sister   . Diabetes Sister   . Hypertension Brother   . Diabetes Brother   . Colon cancer Neg Hx   . Esophageal cancer Neg Hx   . Rectal cancer Neg Hx     Allergies  Allergen Reactions  . Shellfish Allergy     REACTION: throat swells  . Shrimp Flavor     REACTION: throat swells    Current Outpatient  Medications on File Prior to Visit  Medication Sig Dispense Refill  . amLODipine (NORVASC) 5 MG tablet TAKE 1 TABLET DAILY 90 tablet 0  . atorvastatin (LIPITOR) 10 MG tablet Take 1 tablet (10 mg total) by mouth daily. 90 tablet 0  . glipiZIDE (GLUCOTROL) 10 MG tablet TAKE 1 TABLET TWICE A DAY BEFORE MEALS 60 tablet 0  . glucose blood (ACCU-CHEK AVIVA PLUS) test strip Use as instructed 100 each 12  . lisinopril-hydrochlorothiazide (ZESTORETIC) 20-12.5 MG tablet TAKE 1 TABLET DAILY 90 tablet 0  . metFORMIN (GLUCOPHAGE) 1000 MG tablet TAKE 1 TABLET TWICE A DAY WITH MEALS 60 tablet 0   No current facility-administered medications on file prior to visit.    BP 138/76   Temp 97.8 F (36.6 C)   Wt 234 lb (106.1 kg)   BMI 31.74 kg/m       Objective:   Physical Exam Vitals and nursing note reviewed.  Constitutional:      Appearance: Normal appearance.  Cardiovascular:     Rate and Rhythm: Normal rate and regular rhythm.     Pulses: Normal pulses.     Heart sounds: Normal heart sounds.  Pulmonary:     Effort: Pulmonary effort is normal.     Breath sounds: Normal breath sounds.  Musculoskeletal:        General: Normal range of motion.  Skin:    General: Skin is warm and dry.     Capillary Refill: Capillary refill takes less than 2 seconds.  Neurological:     General: No focal deficit present.     Mental Status: He is alert and oriented to person, place, and time.  Psychiatric:        Mood and Affect: Mood normal.        Behavior: Behavior normal.        Thought Content: Thought content normal.        Judgment: Judgment normal.        Assessment & Plan:  1. Controlled type 2 diabetes mellitus with complication, with long-term current use of insulin (HCC)  - POCT A1C- 10.8- has improved but continues to be elevated   - Again, it is recommended starting lantus or trulicity but he refused. He would like to work on lifestyle modifications until February when he comes in for his  CPE  - We reviewed complications of diabetes that is uncontrolled.  - He understands the  diet he needs to eat and to keep up with exercise - Needs to take his medications as directed. - Follow up in February or sooner if needed  Shirline Frees, NP

## 2019-11-25 ENCOUNTER — Encounter: Payer: Self-pay | Admitting: Adult Health

## 2019-11-25 ENCOUNTER — Telehealth: Payer: Self-pay | Admitting: Adult Health

## 2019-11-25 ENCOUNTER — Ambulatory Visit (INDEPENDENT_AMBULATORY_CARE_PROVIDER_SITE_OTHER): Payer: BC Managed Care – PPO | Admitting: Adult Health

## 2019-11-25 ENCOUNTER — Other Ambulatory Visit: Payer: Self-pay

## 2019-11-25 VITALS — BP 140/80 | HR 88 | Temp 98.0°F | Ht 72.5 in | Wt 232.0 lb

## 2019-11-25 DIAGNOSIS — I2699 Other pulmonary embolism without acute cor pulmonale: Secondary | ICD-10-CM

## 2019-11-25 DIAGNOSIS — Z125 Encounter for screening for malignant neoplasm of prostate: Secondary | ICD-10-CM | POA: Diagnosis not present

## 2019-11-25 DIAGNOSIS — Z7901 Long term (current) use of anticoagulants: Secondary | ICD-10-CM | POA: Diagnosis not present

## 2019-11-25 DIAGNOSIS — E1165 Type 2 diabetes mellitus with hyperglycemia: Secondary | ICD-10-CM | POA: Diagnosis not present

## 2019-11-25 DIAGNOSIS — Z23 Encounter for immunization: Secondary | ICD-10-CM | POA: Diagnosis not present

## 2019-11-25 DIAGNOSIS — I10 Essential (primary) hypertension: Secondary | ICD-10-CM

## 2019-11-25 DIAGNOSIS — Z0000 Encounter for general adult medical examination without abnormal findings: Secondary | ICD-10-CM | POA: Diagnosis not present

## 2019-11-25 LAB — CBC WITH DIFFERENTIAL/PLATELET
Basophils Absolute: 0.1 10*3/uL (ref 0.0–0.1)
Basophils Relative: 2.1 % (ref 0.0–3.0)
Eosinophils Absolute: 0.2 10*3/uL (ref 0.0–0.7)
Eosinophils Relative: 3.9 % (ref 0.0–5.0)
HCT: 36.2 % — ABNORMAL LOW (ref 39.0–52.0)
Hemoglobin: 12.2 g/dL — ABNORMAL LOW (ref 13.0–17.0)
Lymphocytes Relative: 38 % (ref 12.0–46.0)
Lymphs Abs: 1.7 10*3/uL (ref 0.7–4.0)
MCHC: 33.9 g/dL (ref 30.0–36.0)
MCV: 82.4 fl (ref 78.0–100.0)
Monocytes Absolute: 0.4 10*3/uL (ref 0.1–1.0)
Monocytes Relative: 10 % (ref 3.0–12.0)
Neutro Abs: 2 10*3/uL (ref 1.4–7.7)
Neutrophils Relative %: 46 % (ref 43.0–77.0)
Platelets: 319 10*3/uL (ref 150.0–400.0)
RBC: 4.39 Mil/uL (ref 4.22–5.81)
RDW: 14.4 % (ref 11.5–15.5)
WBC: 4.4 10*3/uL (ref 4.0–10.5)

## 2019-11-25 LAB — COMPREHENSIVE METABOLIC PANEL
ALT: 12 U/L (ref 0–53)
AST: 13 U/L (ref 0–37)
Albumin: 4.1 g/dL (ref 3.5–5.2)
Alkaline Phosphatase: 68 U/L (ref 39–117)
BUN: 17 mg/dL (ref 6–23)
CO2: 28 mEq/L (ref 19–32)
Calcium: 9.3 mg/dL (ref 8.4–10.5)
Chloride: 100 mEq/L (ref 96–112)
Creatinine, Ser: 1.38 mg/dL (ref 0.40–1.50)
GFR: 65.23 mL/min (ref >60.00)
Glucose, Bld: 226 mg/dL — ABNORMAL HIGH (ref 70–99)
Potassium: 4 mEq/L (ref 3.5–5.1)
Sodium: 135 mEq/L (ref 135–145)
Total Bilirubin: 0.8 mg/dL (ref 0.2–1.2)
Total Protein: 6.7 g/dL (ref 6.0–8.3)

## 2019-11-25 LAB — LIPID PANEL
Cholesterol: 236 mg/dL — ABNORMAL HIGH (ref 0–200)
HDL: 61.5 mg/dL (ref >39.00)
LDL Cholesterol: 154 mg/dL — ABNORMAL HIGH (ref 0–99)
NonHDL: 174.54
Total CHOL/HDL Ratio: 4
Triglycerides: 104 mg/dL (ref 0.0–149.0)
VLDL: 20.8 mg/dL (ref 0.0–40.0)

## 2019-11-25 LAB — PSA: PSA: 2.16 ng/mL (ref 0.10–4.00)

## 2019-11-25 LAB — HEMOGLOBIN A1C: Hgb A1c MFr Bld: 11.3 % — ABNORMAL HIGH (ref 4.6–6.5)

## 2019-11-25 LAB — TSH: TSH: 1.57 u[IU]/mL (ref 0.35–4.50)

## 2019-11-25 MED ORDER — ACCU-CHEK AVIVA PLUS VI STRP: ORAL_STRIP | 12 refills | Status: AC

## 2019-11-25 MED ORDER — JARDIANCE 10 MG PO TABS: 10.0000 mg | ORAL_TABLET | Freq: Every day | ORAL | 0 refills | Status: DC

## 2019-11-25 MED ORDER — RIVAROXABAN 20 MG PO TABS: ORAL_TABLET | ORAL | 3 refills | Status: DC

## 2019-11-25 MED ORDER — GLIPIZIDE ER 10 MG PO TB24: 10.0000 mg | ORAL_TABLET | Freq: Every day | ORAL | 0 refills | Status: DC

## 2019-11-25 NOTE — Telephone Encounter (Signed)
Poke to patient and informed him of his labs.  Cholesterol panel is elevated he needs to start Lipitor.  A1c has gone back up to 11+.  Continues to not want to be on Trulicity or insulin.  Will send in [place].  Follow-up in 3 months

## 2019-11-25 NOTE — Progress Notes (Signed)
Subjective:    Patient ID: Joshua Castaneda, male    DOB: 07/19/1966, 54 y.o.   MRN: [id number]  HPI Patient presents for yearly preventative medicine examination. He is a pleasant 54 year old male who  has a past medical history of Diabetes type 2, controlled ([place]), DVT (deep venous thrombosis) ([place]), Hematuria, Hypertension, Metabolic syndrome, and Pulmonary embolism ([place]).  DM -uncontrolled for some time.  In the past he has been very inconsistent with diet and medication adherence.  Was last seen in December 2020 for diabetes follow-up.  At this time he had been walking routinely over the previous few weeks.  Unfortunately, he had not been eating healthy he had not been taking his medications as directed stating "sometimes I will just split the pill in half".  His A1c was 10.8, this had improved from 14.  It was recommended he start Lantus or Trulicity but he refused.  He wanted to work on lifestyle modifications and actually taking his medications as directed. He reports that his diet has improved and and he is still not taking his medications as directed. He will take his morning dose of metformin and glipizide but often does not take his night time medication due to getting home late at night from work.   Hypertension -currently prescribed Norvasc 5 mg and lisinopril/hydrochlorothiazide 20-12.5 mg.  He denies dizziness, lightheadedness, chest pain, or syncopal episodes.  Again, in the past he has been very inconsistent on taking his medications as directed  BP Readings from Last 3 Encounters:  11/25/19 140/80  10/13/19 138/76  03/03/18 136/90   H/o Pulmonary Embolism -takes Xarelto 20 daily  Hyperlipidemia -currently prescribed Lipitor 10 mg daily. He has not been taking this medication  Lab Results  Component Value Date   CHOL 287 (H) 12/03/2017   HDL 61.70 12/03/2017   LDLCALC 170 (H) 10/17/2016   LDLDIRECT 168.0 12/03/2017   TRIG 261.0 (H) 12/03/2017   CHOLHDL 5 12/03/2017     All immunizations and health maintenance protocols were reviewed with the patient and needed orders were placed. He is due for flu and prevnar  Appropriate screening laboratory values were ordered for the patient including screening of hyperlipidemia, renal function and hepatic function. If indicated by BPH, a PSA was ordered.  Medication reconciliation,  past medical history, social history, problem list and allergies were reviewed in detail with the patient  Goals were established with regard to weight loss, exercise, and  diet in compliance with medications Wt Readings from Last 3 Encounters:  11/25/19 232 lb (105.2 kg)  10/13/19 234 lb (106.1 kg)  03/03/18 243 lb (110.2 kg)    Review of Systems  Constitutional: Negative.   HENT: Negative.   Eyes: Negative.   Respiratory: Negative.   Cardiovascular: Negative.   Gastrointestinal: Negative.   Endocrine: Negative.   Genitourinary: Negative.   Musculoskeletal: Negative.   Skin: Negative.   Allergic/Immunologic: Negative.   Neurological: Negative.   Hematological: Negative.   Psychiatric/Behavioral: Negative.   All other systems reviewed and are negative.  Past Medical History:  Diagnosis Date  . Diabetes type 2, controlled ([place])   . DVT (deep venous thrombosis) ([facility])    had LLE dvt in 1997; no clear provocation.   . Hematuria    neg work up with Urology.   . Hypertension   . Metabolic syndrome   . Pulmonary embolism [facility]     Social History   Socioeconomic History  . Marital status: Married    Spouse  name: Not on file  . Number of children: 1  . Years of education: Not on file  . Highest education level: Not on file  Occupational History  . 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   Tobacco Use  . Smoking status: Never Smoker  . Smokeless tobacco: Never Used  Substance and Sexual Activity  . Alcohol use: Yes    Comment: occasional wine  . Drug use: No  .  Sexual activity: Not on file  Other Topics Concern  . 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 Difficulty of Paying Living Expenses: Not on file  Food Insecurity:   . Worried About Programme researcher, broadcasting/film/video in the Last Year: Not on file  . Ran Out of Food in the Last Year: Not on file  Transportation Needs:   . Lack of Transportation (Medical): Not on file  . Lack of Transportation (Non-Medical): Not on file  Physical Activity:   . Days of Exercise per Week: Not on file  . Minutes of Exercise per Session: Not on file  Stress:   . Feeling of Stress : Not on file  Social Connections:   . Frequency of Communication with Friends and Family: Not on file  . Frequency of Social Gatherings with Friends and Family: Not on file  . Attends Religious Services: Not on file  . Active Member of Clubs or Organizations: Not on file  . Attends Banker Meetings: Not on file  . Marital Status: Not on file  Intimate Partner Violence:   . Fear of Current or Ex-Partner: Not on file  . Emotionally Abused: Not on file  . Physically Abused: Not on file  . Sexually Abused: Not on file    Past Surgical History:  Procedure Laterality Date  . NO PAST SURGERIES      Family History  Problem Relation Age of Onset  . Heart failure Father   . Clotting disorder Father   . Stroke Mother   . Aneurysm Mother        brain  . Hypertension Sister   . Diabetes Sister   . Hypertension Brother   . Diabetes Brother   . Colon cancer Neg Hx   . Esophageal cancer Neg Hx   . Rectal cancer Neg Hx     Allergies  Allergen Reactions  . Shellfish Allergy     REACTION: throat swells  . Shrimp Flavor     REACTION: throat swells    Current Outpatient Medications on File Prior to Visit  Medication Sig Dispense Refill  . amLODipine (NORVASC)  5 MG tablet TAKE 1 TABLET DAILY 90 tablet 0  . lisinopril-hydrochlorothiazide (ZESTORETIC) 20-12.5 MG tablet TAKE 1 TABLET DAILY 90 tablet 0  . metFORMIN (GLUCOPHAGE) 1000 MG tablet TAKE 1 TABLET TWICE A DAY WITH MEALS 60 tablet 0  . atorvastatin (LIPITOR) 10 MG tablet Take 1 tablet (10 mg total) by mouth daily. (Patient not taking: Reported on 11/25/2019) 90 tablet 0   No current facility-administered medications on file prior to visit.    BP 140/80   Pulse 88   Temp 98 F (36.7 C) (Other (Comment))   Ht 6' 0.5" (1.842 m)   Wt 232 lb (105.2 kg)  SpO2 98%   BMI 31.03 kg/m       Objective:   Physical Exam Vitals and nursing note reviewed.  Constitutional:      General: He is not in acute distress.    Appearance: Normal appearance. He is well-developed. He is not diaphoretic.  HENT:     Head: Normocephalic and atraumatic.     Right Ear: Tympanic membrane, ear canal and external ear normal. There is no impacted cerumen.     Left Ear: Tympanic membrane, ear canal and external ear normal. There is no impacted cerumen.     Nose: Nose normal. No congestion or rhinorrhea.     Mouth/Throat:     Mouth: Mucous membranes are moist.     Pharynx: Oropharynx is clear. No oropharyngeal exudate or posterior oropharyngeal erythema.  Eyes:     General:        Right eye: No discharge.        Left eye: No discharge.     Extraocular Movements: Extraocular movements intact.     Conjunctiva/sclera: Conjunctivae normal.     Pupils: Pupils are equal, round, and reactive to light.  Neck:     Thyroid: No thyromegaly.     Trachea: No tracheal deviation.  Cardiovascular:     Rate and Rhythm: Normal rate and regular rhythm.     Pulses: Normal pulses.     Heart sounds: Normal heart sounds. No murmur. No friction rub. No gallop.   Pulmonary:     Effort: Pulmonary effort is normal. No respiratory distress.     Breath sounds: Normal breath sounds. No stridor. No wheezing, rhonchi or rales.  Chest:      Chest wall: No tenderness.  Abdominal:     General: Abdomen is flat. Bowel sounds are normal. There is no distension.     Palpations: Abdomen is soft.     Tenderness: There is no abdominal tenderness. There is no right CVA tenderness, left CVA tenderness, guarding or rebound.     Hernia: No hernia is present.  Musculoskeletal:        General: No swelling, tenderness, deformity or signs of injury. Normal range of motion.     Cervical back: Normal range of motion and neck supple.     Right lower leg: No edema.     Left lower leg: No edema.  Lymphadenopathy:     Cervical: No cervical adenopathy.  Skin:    General: Skin is warm and dry.     Capillary Refill: Capillary refill takes less than 2 seconds.     Coloration: Skin is not jaundiced or pale.     Findings: No bruising, erythema, lesion or rash.  Neurological:     General: No focal deficit present.     Mental Status: He is alert and oriented to person, place, and time.     Cranial Nerves: No cranial nerve deficit.     Sensory: No sensory deficit.     Motor: No weakness.     Coordination: Coordination normal.     Gait: Gait normal.     Deep Tendon Reflexes: Reflexes normal.  Psychiatric:        Mood and Affect: Mood normal.        Behavior: Behavior normal.        Thought Content: Thought content normal.        Judgment: Judgment normal.       Assessment & Plan:  1. Routine general medical examination at a health care facility -  Needs to work on diet and exercise - Follow up in one year or sooner if needed - CBC with Differential/Platelet - Comprehensive metabolic panel - Hemoglobin A1c - Lipid panel - TSH  2. Uncontrolled type 2 diabetes mellitus with hyperglycemia ([facility]) - Will change glipizide to ER for better compliance  - May need to add Trulicity  - Follow up in 3 months  - CBC with Differential/Platelet - Comprehensive metabolic panel - Hemoglobin A1c - Lipid panel - TSH - glipiZIDE (GLUCOTROL XL) 10 MG 24  hr tablet; Take 1 tablet (10 mg total) by mouth daily with breakfast.  Dispense: 90 tablet; Refill: 0  3. Essential hypertension - Continue with current mediations  - CBC with Differential/Platelet - Comprehensive metabolic panel - Hemoglobin A1c - Lipid panel - TSH  4. Chronic anticoagulation  - rivaroxaban (XARELTO) 20 MG TABS tablet; TAKE 1 TABLET DAILY  Dispense: 90 tablet; Refill: 3  5. Prostate cancer screening  - PSA  6. Acute pulmonary embolism, unspecified pulmonary embolism type, unspecified whether acute cor pulmonale present (HCC)  - rivaroxaban (XARELTO) 20 MG TABS tablet; TAKE 1 TABLET DAILY  Dispense: 90 tablet; Refill: 3  7. Need for pneumococcal vaccination  - Pneumococcal polysaccharide vaccine 23-valent greater than or equal to 2yo subcutaneous/IM   Shirline Frees, NP

## 2019-11-25 NOTE — Patient Instructions (Addendum)
-  I will follow up with you once I get your labs back   You have received your pneumonia and flu vaccination   I have changed your glipizide to extended release.   Follow up in three months

## 2019-11-29 ENCOUNTER — Encounter: Payer: Self-pay | Admitting: Adult Health

## 2019-11-30 NOTE — Telephone Encounter (Signed)
Spoke to pt to see what the call was regarding. Pt stated that it was a f/u question but he stated that he will ask Kandee Keen at next ov and not to worry about it.

## 2020-01-17 ENCOUNTER — Ambulatory Visit: Payer: BC Managed Care – PPO | Attending: Internal Medicine

## 2020-01-17 DIAGNOSIS — Z23 Encounter for immunization: Secondary | ICD-10-CM

## 2020-01-17 NOTE — Progress Notes (Signed)
   Covid-19 Vaccination Clinic  Name:  Joshua Castaneda    MRN: [id number] DOB: April 14, 1966  01/17/2020  [name] was observed post Covid-19 immunization for 30 minutes based on pre-vaccination screening without incident. He was provided with Vaccine Information Sheet and instruction to access the V-Safe system.   [name] was instructed to call 911 with any severe reactions post vaccine: Marland Kitchen Difficulty breathing  . Swelling of face and throat  . A fast heartbeat  . A bad rash all over body  . Dizziness and weakness   Immunizations Administered    Name Date Dose VIS Date Route   Pfizer COVID-19 Vaccine 01/17/2020  4:08 PM 0.3 mL 10/01/2019 Intramuscular   Manufacturer: [employer], [employer]   Lot: [id number]   NDC: [address]-0

## 2020-02-07 ENCOUNTER — Ambulatory Visit: Payer: BC Managed Care – PPO | Attending: Internal Medicine

## 2020-02-07 DIAGNOSIS — Z23 Encounter for immunization: Secondary | ICD-10-CM

## 2020-02-07 NOTE — Progress Notes (Signed)
   Covid-19 Vaccination Clinic  Name:  Emanuell Morina    MRN: [id number] DOB: 04/06/1966  02/07/2020  Mr. Roessner was observed post Covid-19 immunization for 15 minutes without incident. He was provided with Vaccine Information Sheet and instruction to access the V-Safe system.   Mr. Steel was instructed to call 911 with any severe reactions post vaccine: Marland Kitchen Difficulty breathing  . Swelling of face and throat  . A fast heartbeat  . A bad rash all over body  . Dizziness and weakness   Immunizations Administered    Name Date Dose VIS Date Route   Pfizer COVID-19 Vaccine 02/07/2020  4:31 PM 0.3 mL 12/15/2018 Intramuscular   Manufacturer: [employer], [employer]   Lot: [id number]   NDC: [address]-6

## 2020-02-09 ENCOUNTER — Ambulatory Visit: Payer: BC Managed Care – PPO

## 2020-02-22 ENCOUNTER — Ambulatory Visit: Payer: BC Managed Care – PPO | Admitting: Adult Health

## 2020-03-08 ENCOUNTER — Other Ambulatory Visit: Payer: Self-pay | Admitting: Adult Health

## 2020-03-08 DIAGNOSIS — I10 Essential (primary) hypertension: Secondary | ICD-10-CM

## 2020-03-09 NOTE — Telephone Encounter (Signed)
Lisinopril and amlodipine filled for 9 months.  Metformin filled for 90 days.  Pt has upcoming follow up on 03/21/2020.

## 2020-03-21 ENCOUNTER — Ambulatory Visit: Payer: BC Managed Care – PPO | Admitting: Adult Health

## 2020-05-25 ENCOUNTER — Other Ambulatory Visit: Payer: Self-pay | Admitting: Adult Health

## 2020-05-25 DIAGNOSIS — E1165 Type 2 diabetes mellitus with hyperglycemia: Secondary | ICD-10-CM

## 2020-05-29 NOTE — Telephone Encounter (Signed)
Denied.  Needs appointment for A1C and follow up.

## 2020-06-23 ENCOUNTER — Ambulatory Visit: Payer: BC Managed Care – PPO | Admitting: Adult Health

## 2020-08-25 ENCOUNTER — Other Ambulatory Visit: Payer: Self-pay

## 2020-08-25 ENCOUNTER — Ambulatory Visit (INDEPENDENT_AMBULATORY_CARE_PROVIDER_SITE_OTHER): Payer: BC Managed Care – PPO | Admitting: Adult Health

## 2020-08-25 ENCOUNTER — Encounter: Payer: Self-pay | Admitting: Adult Health

## 2020-08-25 VITALS — BP 140/90 | HR 90 | Temp 98.3°F | Ht 72.5 in | Wt 230.5 lb

## 2020-08-25 DIAGNOSIS — I10 Essential (primary) hypertension: Secondary | ICD-10-CM | POA: Diagnosis not present

## 2020-08-25 DIAGNOSIS — E1165 Type 2 diabetes mellitus with hyperglycemia: Secondary | ICD-10-CM

## 2020-08-25 DIAGNOSIS — Z23 Encounter for immunization: Secondary | ICD-10-CM | POA: Diagnosis not present

## 2020-08-25 LAB — POCT GLYCOSYLATED HEMOGLOBIN (HGB A1C): Hemoglobin A1C: 10.8 % — AB (ref 4.0–5.6)

## 2020-08-25 MED ORDER — AMLODIPINE BESYLATE 5 MG PO TABS: 5.0000 mg | ORAL_TABLET | Freq: Every day | ORAL | 2 refills | Status: DC

## 2020-08-25 MED ORDER — GLIPIZIDE ER 10 MG PO TB24: 10.0000 mg | ORAL_TABLET | Freq: Every day | ORAL | 0 refills | Status: DC

## 2020-08-25 MED ORDER — EMPAGLIFLOZIN 25 MG PO TABS: 25.0000 mg | ORAL_TABLET | Freq: Every day | ORAL | 0 refills | Status: DC

## 2020-08-25 MED ORDER — METFORMIN HCL 1000 MG PO TABS: 1000.0000 mg | ORAL_TABLET | Freq: Two times a day (BID) | ORAL | 0 refills | Status: DC

## 2020-08-25 MED ORDER — LISINOPRIL-HYDROCHLOROTHIAZIDE 20-12.5 MG PO TABS: 1.0000 | ORAL_TABLET | Freq: Every day | ORAL | 2 refills | Status: DC

## 2020-08-25 NOTE — Progress Notes (Signed)
Subjective:    Patient ID: Joshua Castaneda, male    DOB: 12/04/1965, 54 y.o.   MRN: [id number]  HPI 54 year old male who  has a past medical history of Diabetes type 2, controlled ([facility]), DVT (deep venous thrombosis) ([facility]), Hematuria, Hypertension, Metabolic syndrome, and Pulmonary embolism ([facility]).   He presents to the office today for follow up regarding DM.  Was last seen in February 2021 and is about 6 months overdue for his diabetes follow-up.  He has been uncontrolled for some time.  In the past he has been inconsistent with diet and medication adherence.  During his last visit he reported that his diet had improved but he was not taking his medications as directed.  He will take his morning dose of Metformin, and glipizide but would often skip his evening dose of medication due to getting home late from work.  He refused Trulicity and or insulin therapy in the past.  His glipizide dose was changed to extended release for better compliance and he was started on Jardiance 10 mg daily his last A1c in February was 11.3  Unfortunately, he has not had any follow-up appointments he has been out of his medications for at least 3 months.He reports that he was taking jardiance twice a day and was causing him to have acid reflux so he stopped the medication. He is still walking on the weekends.   Lab Results  Component Value Date   HGBA1C 11.3 (H) 11/25/2019   Essential Hypertension - takes HCTZ/Lisinopril 20-12.5 mg and Norvac 5 mg. He has been out of this medication as well. Does take this on a daily basis.  BP Readings from Last 3 Encounters:  08/25/20 140/90  11/25/19 140/80  10/13/19 138/76     Review of Systems See HPI   Past Medical History:  Diagnosis Date   Diabetes type 2, controlled ([facility])    DVT (deep venous thrombosis) ([facility])    had LLE dvt in 1997; no clear provocation.    Hematuria    neg work up with Urology.    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, engineering    Social Determinants of Corporate investment banker Strain:    Difficulty of Paying Living Expenses: Not on file  Food Insecurity:    Worried About Programme researcher, broadcasting/film/video in the Last Year: Not on file   [employer] of Food in the Last Year: Not on file  Transportation Needs:    Lack of Transportation (Medical): Not on file   Lack of Transportation (Non-Medical): Not on file  Physical Activity:    Days  of Exercise per Week: Not on file   Minutes of Exercise per Session: Not on file  Stress:    Feeling of Stress : Not on file  Social Connections:    Frequency of Communication with Friends and Family: Not on file   Frequency of Social Gatherings with Friends and Family: Not on file   Attends Religious Services: Not on file   Active Member of Clubs or Organizations: Not on file   Attends Banker Meetings: Not on file   Marital Status: Not on file  Intimate Partner Violence:    Fear of Current or Ex-Partner: Not on file   Emotionally Abused: Not on file   Physically Abused: Not on file   Sexually Abused: Not on file    Past Surgical History:  Procedure Laterality Date   NO PAST SURGERIES      Family History  Problem Relation Age of Onset   Heart failure  Father    Clotting disorder Father    Stroke Mother    Aneurysm Mother        brain   Hypertension Sister    Diabetes Sister    Hypertension Brother    Diabetes Brother    Colon cancer Neg Hx    Esophageal cancer Neg Hx    Rectal cancer Neg Hx     Allergies  Allergen Reactions   Shellfish Allergy     REACTION: throat swells   Shrimp Flavor     REACTION: throat swells    Current Outpatient Medications on File Prior to Visit  Medication Sig Dispense Refill   atorvastatin (LIPITOR) 10 MG tablet Take 1 tablet (10 mg total) by mouth daily. 90 tablet 0   glucose blood (ACCU-CHEK AVIVA PLUS) test strip Use as instructed 100 each 12   rivaroxaban (XARELTO) 20 MG TABS tablet TAKE 1 TABLET DAILY 90 tablet 3   No current facility-administered medications on file prior to visit.    BP 140/90 (BP Location: Left Arm, Patient Position: Sitting, Cuff Size: Large)    Pulse 90    Temp 98.3 F (36.8 C) (Oral)    Ht 6' 0.5" (1.842 m)    Wt 230 lb 8 oz (104.6 kg)    SpO2 98%    BMI 30.83 kg/m       Objective:   Physical Exam Vitals and nursing note reviewed.  Constitutional:      General: He is not in acute distress.    Appearance: Normal appearance. He is well-developed and normal weight.  HENT:     Head: Normocephalic and atraumatic.     Right Ear: Tympanic membrane, ear canal and external ear normal. There is no impacted cerumen.     Left Ear: Tympanic membrane, ear canal and external ear normal. There is no impacted cerumen.     Nose: Nose normal. No congestion or rhinorrhea.     Mouth/Throat:     Mouth: Mucous membranes are moist.     Pharynx: Oropharynx is clear. No oropharyngeal exudate or posterior oropharyngeal erythema.  Eyes:     General:        Right eye: No discharge.        Left eye: No discharge.     Extraocular Movements: Extraocular movements intact.     Conjunctiva/sclera: Conjunctivae normal.     Pupils: Pupils are equal, round, and reactive to  light.  Neck:     Vascular: No carotid bruit.     Trachea: No tracheal deviation.  Cardiovascular:     Rate and Rhythm: Normal rate and regular rhythm.     Pulses: Normal pulses.     Heart sounds: Normal heart sounds. No murmur heard.  No friction rub. No gallop.   Pulmonary:     Effort: Pulmonary effort is normal. No respiratory distress.     Breath sounds: Normal breath sounds. No stridor. No wheezing, rhonchi or rales.  Chest:     Chest wall: No tenderness.  Abdominal:     General: Bowel sounds are normal. There is no distension.     Palpations: Abdomen is soft. There is no mass.     Tenderness: There is no abdominal tenderness. There is no right CVA tenderness, left CVA tenderness, guarding or rebound.     Hernia: No hernia is present.  Musculoskeletal:        General: No swelling, tenderness, deformity or signs of injury. Normal range of motion.     Right lower leg: No edema.     Left lower leg: No edema.  Lymphadenopathy:     Cervical: No cervical adenopathy.  Skin:    General: Skin is warm and dry.     Capillary Refill: Capillary refill takes less than 2 seconds.     Coloration: Skin is not jaundiced or pale.     Findings: No bruising, erythema, lesion or rash.  Neurological:     General: No focal deficit present.     Mental Status: He is alert and oriented to person, place, and time.     Cranial Nerves: No cranial nerve deficit.     Sensory: No sensory deficit.     Motor: No weakness.     Coordination: Coordination normal.     Gait: Gait normal.     Deep Tendon Reflexes: Reflexes normal.  Psychiatric:        Mood and Affect: Mood normal.        Behavior: Behavior normal.        Thought Content: Thought content normal.        Judgment: Judgment normal.       Assessment & Plan:  1. Uncontrolled type 2 diabetes mellitus with hyperglycemia (HCC) - A1c 10.8 - not at goal. Will increase Jardiance 25 mg and take it daily. He needs to take his medication as directed  and follow up in 3 months for CPE.  - empagliflozin (JARDIANCE) 25 MG TABS tablet; Take 1 tablet (25 mg total) by mouth daily before breakfast.  Dispense: 90 tablet; Refill: 0 - glipiZIDE (GLUCOTROL XL) 10 MG 24 hr tablet; Take 1 tablet (10 mg total) by mouth daily with breakfast.  Dispense: 90 tablet; Refill: 0 - metFORMIN (GLUCOPHAGE) 1000 MG tablet; Take 1 tablet (1,000 mg total) by mouth 2 (two) times daily with a meal.  Dispense: 180 tablet; Refill: 0 - POCT glycosylated hemoglobin (Hb A1C)  2. Essential hypertension  - amLODipine (NORVASC) 5 MG tablet; Take 1 tablet (5 mg total) by mouth daily.  Dispense: 90 tablet; Refill: 2 - lisinopril-hydrochlorothiazide (ZESTORETIC) 20-12.5 MG tablet; Take 1 tablet by mouth daily.  Dispense: 90 tablet; Refill: 2  3. Need for influenza vaccination  - Flu Vaccine QUAD 6+ mos PF IM (Fluarix Quad PF)  Shirline Frees, NP

## 2020-11-30 ENCOUNTER — Other Ambulatory Visit: Payer: Self-pay

## 2020-12-01 ENCOUNTER — Encounter: Payer: Self-pay | Admitting: Adult Health

## 2020-12-01 ENCOUNTER — Other Ambulatory Visit: Payer: Self-pay | Admitting: Adult Health

## 2020-12-01 ENCOUNTER — Ambulatory Visit (INDEPENDENT_AMBULATORY_CARE_PROVIDER_SITE_OTHER): Payer: BC Managed Care – PPO | Admitting: Adult Health

## 2020-12-01 VITALS — BP 140/96 | Temp 98.0°F | Ht 72.0 in | Wt 230.0 lb

## 2020-12-01 DIAGNOSIS — I10 Essential (primary) hypertension: Secondary | ICD-10-CM | POA: Diagnosis not present

## 2020-12-01 DIAGNOSIS — Z125 Encounter for screening for malignant neoplasm of prostate: Secondary | ICD-10-CM | POA: Diagnosis not present

## 2020-12-01 DIAGNOSIS — Z0000 Encounter for general adult medical examination without abnormal findings: Secondary | ICD-10-CM | POA: Diagnosis not present

## 2020-12-01 DIAGNOSIS — Z7901 Long term (current) use of anticoagulants: Secondary | ICD-10-CM

## 2020-12-01 DIAGNOSIS — E1165 Type 2 diabetes mellitus with hyperglycemia: Secondary | ICD-10-CM

## 2020-12-01 DIAGNOSIS — E782 Mixed hyperlipidemia: Secondary | ICD-10-CM

## 2020-12-01 DIAGNOSIS — I2699 Other pulmonary embolism without acute cor pulmonale: Secondary | ICD-10-CM

## 2020-12-01 LAB — CBC WITH DIFFERENTIAL/PLATELET
Basophils Absolute: 0.1 10*3/uL (ref 0.0–0.1)
Basophils Relative: 1.4 % (ref 0.0–3.0)
Eosinophils Absolute: 0.2 10*3/uL (ref 0.0–0.7)
Eosinophils Relative: 4.1 % (ref 0.0–5.0)
HCT: 33.6 % — ABNORMAL LOW (ref 39.0–52.0)
Hemoglobin: 11.1 g/dL — ABNORMAL LOW (ref 13.0–17.0)
Lymphocytes Relative: 32.1 % (ref 12.0–46.0)
Lymphs Abs: 1.3 10*3/uL (ref 0.7–4.0)
MCHC: 33 g/dL (ref 30.0–36.0)
MCV: 80.3 fl (ref 78.0–100.0)
Monocytes Absolute: 0.4 10*3/uL (ref 0.1–1.0)
Monocytes Relative: 10.7 % (ref 3.0–12.0)
Neutro Abs: 2 10*3/uL (ref 1.4–7.7)
Neutrophils Relative %: 51.7 % (ref 43.0–77.0)
Platelets: 376 10*3/uL (ref 150.0–400.0)
RBC: 4.18 Mil/uL — ABNORMAL LOW (ref 4.22–5.81)
RDW: 15.2 % (ref 11.5–15.5)
WBC: 4 10*3/uL (ref 4.0–10.5)

## 2020-12-01 LAB — COMPREHENSIVE METABOLIC PANEL
ALT: 9 U/L (ref 0–53)
AST: 12 U/L (ref 0–37)
Albumin: 4 g/dL (ref 3.5–5.2)
Alkaline Phosphatase: 54 U/L (ref 39–117)
BUN: 21 mg/dL (ref 6–23)
CO2: 26 mEq/L (ref 19–32)
Calcium: 9.3 mg/dL (ref 8.4–10.5)
Chloride: 102 mEq/L (ref 96–112)
Creatinine, Ser: 1.34 mg/dL (ref 0.40–1.50)
GFR: 60.29 mL/min (ref >60.00)
Glucose, Bld: 73 mg/dL (ref 70–99)
Potassium: 4.2 mEq/L (ref 3.5–5.1)
Sodium: 136 mEq/L (ref 135–145)
Total Bilirubin: 0.7 mg/dL (ref 0.2–1.2)
Total Protein: 6.9 g/dL (ref 6.0–8.3)

## 2020-12-01 LAB — TSH: TSH: 0.97 u[IU]/mL (ref 0.35–4.50)

## 2020-12-01 LAB — LIPID PANEL
Cholesterol: 216 mg/dL — ABNORMAL HIGH (ref 0–200)
HDL: 61.1 mg/dL (ref >39.00)
LDL Cholesterol: 135 mg/dL — ABNORMAL HIGH (ref 0–99)
NonHDL: 155.07
Total CHOL/HDL Ratio: 4
Triglycerides: 101 mg/dL (ref 0.0–149.0)
VLDL: 20.2 mg/dL (ref 0.0–40.0)

## 2020-12-01 LAB — HEMOGLOBIN A1C: Hgb A1c MFr Bld: 7.1 % — ABNORMAL HIGH (ref 4.6–6.5)

## 2020-12-01 LAB — PSA: PSA: 2.09 ng/mL (ref 0.10–4.00)

## 2020-12-01 MED ORDER — METFORMIN HCL 1000 MG PO TABS: 1000.0000 mg | ORAL_TABLET | Freq: Two times a day (BID) | ORAL | 0 refills | Status: DC

## 2020-12-01 MED ORDER — FREESTYLE LIBRE 2 SENSOR MISC: 0 refills | Status: DC

## 2020-12-01 MED ORDER — EMPAGLIFLOZIN 25 MG PO TABS: 25.0000 mg | ORAL_TABLET | Freq: Every day | ORAL | 0 refills | Status: DC

## 2020-12-01 MED ORDER — GLIPIZIDE ER 10 MG PO TB24: 10.0000 mg | ORAL_TABLET | Freq: Every day | ORAL | 0 refills | Status: DC

## 2020-12-01 MED ORDER — RIVAROXABAN 20 MG PO TABS: ORAL_TABLET | ORAL | 3 refills | Status: DC

## 2020-12-01 MED ORDER — LISINOPRIL-HYDROCHLOROTHIAZIDE 20-12.5 MG PO TABS
1.0000 | ORAL_TABLET | Freq: Every day | ORAL | 3 refills | Status: DC
Start: 2020-12-01 — End: 2022-03-08

## 2020-12-01 MED ORDER — AMLODIPINE BESYLATE 5 MG PO TABS: 5.0000 mg | ORAL_TABLET | Freq: Every day | ORAL | 3 refills | Status: DC

## 2020-12-01 MED ORDER — ATORVASTATIN CALCIUM 10 MG PO TABS
10.0000 mg | ORAL_TABLET | Freq: Every day | ORAL | 3 refills | Status: DC
Start: 2020-12-01 — End: 2022-07-16

## 2020-12-01 NOTE — Progress Notes (Signed)
Subjective:    Patient ID: Joshua Castaneda, male    DOB: [birth year], 55 y.o.   MRN: [id number]  HPI Patient presents for yearly preventative medicine examination. He is a pleasant 55 year old male who  has a past medical history of Diabetes type 2, controlled (HCC), DVT (deep venous thrombosis) (HCC), Hematuria, Hypertension, Metabolic syndrome, and Pulmonary embolism (HCC).  DM -was last seen November 2021. He has been uncontrolled for some time now this is due to inconsistencies with diet, medication adherence, and follow-up appointments. When he was last seen he had not had any follow-up appointments and unfortunately was out of his medication for at least 3 months. At this time he was on Jardiance 10 mg, glipizide 10 mg extended release daily, and Metformin 1000 mg twice daily. Reported that he was taking Jardiance twice a day and this was causing acid reflux.  Today he reports that he is now taking his medication every day and has not had any issues with hypoglycemia. He does not check his blood sugar on a routine basis but is interested in the [place] system.  Lab Results  Component Value Date   HGBA1C 10.8 (A) 08/25/2020   Hypertension-scribe Norvasc 5 mg and lisinopril/hydrochlorothiazide 20-12.5 mg. He denies dizziness, lightheadedness, chest pain, or syncopal episodes. In the past he has been inconsistent on taking medication as directed. He reports that he is taking his medications as directed now, has not taken his blood pressure medication this morning.  BP Readings from Last 3 Encounters:  12/01/20 (!) 140/96  08/25/20 140/90  11/25/19 140/80   History of pulmonary embolism-takes Xarelto 20 mg daily  Hyperlipidemia-prescribed Lipitor 10 mg daily but takes this inconsistently. Reports that he is not taking this medication  Lab Results  Component Value Date   CHOL 236 (H) 11/25/2019   HDL 61.50 11/25/2019   LDLCALC 154 (H) 11/25/2019   LDLDIRECT 168.0 12/03/2017   TRIG 104.0  11/25/2019   CHOLHDL 4 11/25/2019    All immunizations and health maintenance protocols were reviewed with the patient and needed orders were placed.  Appropriate screening laboratory values were ordered for the patient including screening of hyperlipidemia, renal function and hepatic function. If indicated by BPH, a PSA was ordered.  Medication reconciliation,  past medical history, social history, problem list and allergies were reviewed in detail with the patient  Goals were established with regard to weight loss, exercise, and  diet in compliance with medications. He is walking on a routine basis   Wt Readings from Last 3 Encounters:  12/01/20 230 lb (104.3 kg)  08/25/20 230 lb 8 oz (104.6 kg)  11/25/19 232 lb (105.2 kg)    He is up-to-date on routine colon cancer screening  Review of Systems  Constitutional: Negative.   HENT: Negative.   Eyes: Negative.   Respiratory: Negative.   Cardiovascular: Negative.   Gastrointestinal: Negative.   Endocrine: Negative.   Genitourinary: Negative.   Musculoskeletal: Negative.   Skin: Negative.   Allergic/Immunologic: Negative.   Neurological: Negative.   Hematological: Negative.   Psychiatric/Behavioral: Negative.   All other systems reviewed and are negative.  Past Medical History:  Diagnosis Date  . Diabetes type 2, controlled (HCC)   . DVT (deep venous thrombosis) (HCC)    had LLE dvt in 1997; no clear provocation.   . Hematuria    neg work up with Urology.   . Hypertension   . Metabolic syndrome   . Pulmonary embolism (HCC)  Social History   Socioeconomic History  . Marital status: Married    Spouse name: Not on file  . Number of children: 1  . Years of education: Not on file  . Highest education level: Not on file  Occupational History  . 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   Tobacco Use  . Smoking status: Never Smoker  . Smokeless tobacco: Never  Used  Substance and Sexual Activity  . Alcohol use: Yes    Comment: occasional wine  . Drug use: No  . Sexual activity: Not on file  Other Topics Concern  . 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 NO PAST SURGERIES      Family History  Problem Relation Age of Onset  . Heart failure Father   . Clotting disorder Father   . Stroke Mother   . Aneurysm Mother        brain  . Hypertension Sister   . Diabetes Sister   . Hypertension Brother   . Diabetes Brother   . Colon cancer Neg Hx   . Esophageal cancer Neg Hx   . Rectal cancer Neg Hx     Allergies  Allergen Reactions  . Shellfish Allergy     REACTION: throat swells  . Shrimp Flavor     REACTION: throat swells    Current Outpatient Medications on File Prior to Visit  Medication Sig Dispense Refill  . amLODipine (NORVASC) 5 MG tablet Take 1 tablet (5 mg total) by mouth daily. 90 tablet 2  . empagliflozin (JARDIANCE) 25 MG TABS tablet Take 1 tablet (25 mg total) by mouth daily before breakfast. 90 tablet 0  . glipiZIDE (GLUCOTROL XL) 10 MG 24 hr tablet Take 1 tablet (10 mg total) by mouth daily with breakfast. 90 tablet 0  . glucose blood (ACCU-CHEK AVIVA PLUS) test strip Use as instructed 100 each 12  . lisinopril-hydrochlorothiazide (ZESTORETIC) 20-12.5 MG tablet Take 1 tablet by mouth daily. 90 tablet 2  . metFORMIN (GLUCOPHAGE) 1000 MG tablet Take 1 tablet (1,000 mg total) by mouth 2 (two) times daily with a meal. 180 tablet 0  . rivaroxaban (XARELTO) 20 MG TABS tablet TAKE 1 TABLET DAILY 90 tablet  3   No current facility-administered medications on file prior to visit.    BP (!) 140/96   Temp 98 F (36.7 C) (Oral)   Ht 6' (1.829 m) Comment: WITH SHOES  Wt 230 lb (104.3 kg)   BMI 31.19 kg/m       Objective:   Physical Exam Vitals and nursing note reviewed.  Constitutional:      Appearance: Normal appearance.  Cardiovascular:     Rate and Rhythm: Normal rate and regular rhythm.     Pulses: Normal pulses.     Heart sounds: Normal heart sounds.  Pulmonary:     Effort: Pulmonary effort is normal.     Breath sounds: Normal breath sounds.  Abdominal:  General: Abdomen is flat.     Palpations: Abdomen is soft.  Musculoskeletal:        General: Normal range of motion.  Skin:    General: Skin is warm and dry.  Neurological:     General: No focal deficit present.     Mental Status: He is alert and oriented to person, place, and time.  Psychiatric:        Mood and Affect: Mood normal.        Behavior: Behavior normal.        Thought Content: Thought content normal.        Judgment: Judgment normal.       Assessment & Plan:  1. Routine general medical examination at a health care facility - Follow up in one year  - Continue eating healthy and exercising  - CBC with Differential/Platelet; Future - Comprehensive metabolic panel; Future - Hemoglobin A1c; Future - Lipid panel; Future - TSH; Future  2. Uncontrolled type 2 diabetes mellitus with hyperglycemia (HCC) - Consider adding agent.  - Will send in [place] system  - sample libre sensor placed on patient today  - Follow up in three months  - CBC with Differential/Platelet; Future - Comprehensive metabolic panel; Future - Hemoglobin A1c; Future - Lipid panel; Future - TSH; Future  3. Essential hypertension - Did not take medication this morning.  - Likely controlled with the medication he is taking  - Monitor at home  - CBC with Differential/Platelet; Future - Comprehensive metabolic panel; Future -  Hemoglobin A1c; Future - Lipid panel; Future - TSH; Future  4. Chronic anticoagulation - Continue with   5. Prostate cancer screening  - PSA; Future  6. Mixed hyperlipidemia - start taking statin  - CBC with Differential/Platelet; Future - Comprehensive metabolic panel; Future - Hemoglobin A1c; Future - Lipid panel; Future - TSH; Future

## 2020-12-01 NOTE — Addendum Note (Signed)
Addended by: Lerry Liner on: 12/01/2020 07:59 AM   Modules accepted: Orders

## 2020-12-16 DIAGNOSIS — J019 Acute sinusitis, unspecified: Secondary | ICD-10-CM | POA: Diagnosis not present

## 2021-04-29 ENCOUNTER — Other Ambulatory Visit: Payer: Self-pay | Admitting: Adult Health

## 2021-04-29 DIAGNOSIS — E1165 Type 2 diabetes mellitus with hyperglycemia: Secondary | ICD-10-CM

## 2021-05-01 MED ORDER — EMPAGLIFLOZIN 25 MG PO TABS: 25.0000 mg | ORAL_TABLET | Freq: Every day | ORAL | 0 refills | Status: DC

## 2021-05-01 MED ORDER — GLIPIZIDE ER 10 MG PO TB24: 10.0000 mg | ORAL_TABLET | Freq: Every day | ORAL | 0 refills | Status: DC

## 2021-08-07 ENCOUNTER — Other Ambulatory Visit: Payer: Self-pay | Admitting: Adult Health

## 2021-08-07 DIAGNOSIS — E1165 Type 2 diabetes mellitus with hyperglycemia: Secondary | ICD-10-CM

## 2022-03-08 ENCOUNTER — Other Ambulatory Visit: Payer: Self-pay | Admitting: Adult Health

## 2022-03-08 DIAGNOSIS — I10 Essential (primary) hypertension: Secondary | ICD-10-CM

## 2022-03-08 DIAGNOSIS — Z7901 Long term (current) use of anticoagulants: Secondary | ICD-10-CM

## 2022-03-08 DIAGNOSIS — I2699 Other pulmonary embolism without acute cor pulmonale: Secondary | ICD-10-CM

## 2022-06-27 ENCOUNTER — Other Ambulatory Visit: Payer: Self-pay | Admitting: Adult Health

## 2022-06-27 DIAGNOSIS — E1165 Type 2 diabetes mellitus with hyperglycemia: Secondary | ICD-10-CM

## 2022-07-16 ENCOUNTER — Other Ambulatory Visit: Payer: Self-pay | Admitting: Adult Health

## 2022-07-16 ENCOUNTER — Ambulatory Visit (INDEPENDENT_AMBULATORY_CARE_PROVIDER_SITE_OTHER): Payer: BC Managed Care – PPO | Admitting: Adult Health

## 2022-07-16 ENCOUNTER — Encounter: Payer: Self-pay | Admitting: Adult Health

## 2022-07-16 VITALS — BP 140/80 | HR 85 | Temp 98.0°F | Ht 72.0 in | Wt 231.6 lb

## 2022-07-16 DIAGNOSIS — E1165 Type 2 diabetes mellitus with hyperglycemia: Secondary | ICD-10-CM

## 2022-07-16 DIAGNOSIS — I2699 Other pulmonary embolism without acute cor pulmonale: Secondary | ICD-10-CM

## 2022-07-16 DIAGNOSIS — Z1159 Encounter for screening for other viral diseases: Secondary | ICD-10-CM

## 2022-07-16 DIAGNOSIS — Z0000 Encounter for general adult medical examination without abnormal findings: Secondary | ICD-10-CM | POA: Diagnosis not present

## 2022-07-16 DIAGNOSIS — Z7901 Long term (current) use of anticoagulants: Secondary | ICD-10-CM

## 2022-07-16 DIAGNOSIS — I10 Essential (primary) hypertension: Secondary | ICD-10-CM

## 2022-07-16 DIAGNOSIS — E782 Mixed hyperlipidemia: Secondary | ICD-10-CM

## 2022-07-16 DIAGNOSIS — Z125 Encounter for screening for malignant neoplasm of prostate: Secondary | ICD-10-CM | POA: Diagnosis not present

## 2022-07-16 LAB — COMPREHENSIVE METABOLIC PANEL
ALT: 15 U/L (ref 0–53)
AST: 17 U/L (ref 0–37)
Albumin: 4.4 g/dL (ref 3.5–5.2)
Alkaline Phosphatase: 70 U/L (ref 39–117)
BUN: 19 mg/dL (ref 6–23)
CO2: 27 mEq/L (ref 19–32)
Calcium: 9.9 mg/dL (ref 8.4–10.5)
Chloride: 99 mEq/L (ref 96–112)
Creatinine, Ser: 1.5 mg/dL (ref 0.40–1.50)
GFR: 52.06 mL/min — ABNORMAL LOW (ref >60.00)
Glucose, Bld: 146 mg/dL — ABNORMAL HIGH (ref 70–99)
Potassium: 4.4 mEq/L (ref 3.5–5.1)
Sodium: 135 mEq/L (ref 135–145)
Total Bilirubin: 0.8 mg/dL (ref 0.2–1.2)
Total Protein: 7.6 g/dL (ref 6.0–8.3)

## 2022-07-16 LAB — CBC WITH DIFFERENTIAL/PLATELET
Basophils Absolute: 0.1 10*3/uL (ref 0.0–0.1)
Basophils Relative: 2.1 % (ref 0.0–3.0)
Eosinophils Absolute: 0.2 10*3/uL (ref 0.0–0.7)
Eosinophils Relative: 3.4 % (ref 0.0–5.0)
HCT: 41.5 % (ref 39.0–52.0)
Hemoglobin: 13.7 g/dL (ref 13.0–17.0)
Lymphocytes Relative: 42 % (ref 12.0–46.0)
Lymphs Abs: 1.9 10*3/uL (ref 0.7–4.0)
MCHC: 33.1 g/dL (ref 30.0–36.0)
MCV: 84.1 fl (ref 78.0–100.0)
Monocytes Absolute: 0.5 10*3/uL (ref 0.1–1.0)
Monocytes Relative: 11.6 % (ref 3.0–12.0)
Neutro Abs: 1.9 10*3/uL (ref 1.4–7.7)
Neutrophils Relative %: 40.9 % — ABNORMAL LOW (ref 43.0–77.0)
Platelets: 323 10*3/uL (ref 150.0–400.0)
RBC: 4.94 Mil/uL (ref 4.22–5.81)
RDW: 14.8 % (ref 11.5–15.5)
WBC: 4.5 10*3/uL (ref 4.0–10.5)

## 2022-07-16 LAB — LIPID PANEL
Cholesterol: 265 mg/dL — ABNORMAL HIGH (ref 0–200)
HDL: 74.5 mg/dL (ref >39.00)
LDL Cholesterol: 164 mg/dL — ABNORMAL HIGH (ref 0–99)
NonHDL: 190.98
Total CHOL/HDL Ratio: 4
Triglycerides: 134 mg/dL (ref 0.0–149.0)
VLDL: 26.8 mg/dL (ref 0.0–40.0)

## 2022-07-16 LAB — PSA: PSA: 2.36 ng/mL (ref 0.10–4.00)

## 2022-07-16 LAB — MICROALBUMIN / CREATININE URINE RATIO
Creatinine,U: 133.4 mg/dL
Microalb Creat Ratio: 0.8 mg/g (ref 0.0–30.0)
Microalb, Ur: 1 mg/dL (ref 0.0–1.9)

## 2022-07-16 LAB — HEMOGLOBIN A1C: Hgb A1c MFr Bld: 9.8 % — ABNORMAL HIGH (ref 4.6–6.5)

## 2022-07-16 LAB — TSH: TSH: 1.75 u[IU]/mL (ref 0.35–5.50)

## 2022-07-16 MED ORDER — ATORVASTATIN CALCIUM 40 MG PO TABS: 40.0000 mg | ORAL_TABLET | Freq: Every day | ORAL | 3 refills | Status: DC

## 2022-07-16 MED ORDER — GLIPIZIDE ER 10 MG PO TB24: 10.0000 mg | ORAL_TABLET | Freq: Two times a day (BID) | ORAL | 0 refills | Status: DC

## 2022-07-16 MED ORDER — FREESTYLE LIBRE 3 SENSOR MISC: 1.0000 | 11 refills | Status: DC

## 2022-07-16 MED ORDER — RIVAROXABAN 20 MG PO TABS: 20.0000 mg | ORAL_TABLET | Freq: Every day | ORAL | 3 refills | Status: DC

## 2022-07-16 MED ORDER — METFORMIN HCL 1000 MG PO TABS: ORAL_TABLET | ORAL | 3 refills | Status: DC

## 2022-07-16 MED ORDER — EMPAGLIFLOZIN 25 MG PO TABS: 25.0000 mg | ORAL_TABLET | Freq: Every day | ORAL | 0 refills | Status: DC

## 2022-07-16 NOTE — Patient Instructions (Signed)
It was great seeing you today   We will follow up with you regarding your lab work   Please let me know if you need anything   

## 2022-07-16 NOTE — Progress Notes (Signed)
Subjective:    Patient ID: Joshua Castaneda, male    DOB: 03-18-1966, 56 y.o.   MRN: [id number]  HPI Patient presents for yearly preventative medicine examination. He is a 56 year old male who  has a past medical history of Diabetes type 2, controlled (HCC), DVT (deep venous thrombosis) (HCC), Hematuria, Hypertension, Metabolic syndrome, and Pulmonary embolism (HCC).  Diabetes mellitus type 2-was last seen in February 2022.  He has been uncontrolled for some time now due to inconsistencies with diet, medication adherence, and follow-up appointments.  He is currently managed with glipizide 10 mg standard release daily, Jardiance 25 mg daily, and metformin 1000 mg twice daily.  Lab Results  Component Value Date   HGBA1C 7.1 (H) 12/01/2020   Hypertension-managed with Norvasc 5 mg daily and lisinopril/hydrochlorothiazide 20-12.5 mg daily.  He denies dizziness, lightheadedness, chest pain, or syncopal episodes. He does not check his blood pressure at home  BP Readings from Last 3 Encounters:  07/16/22 (!) 140/80  12/01/20 (!) 140/96  08/25/20 140/90   Hyperlipidemia -managed with Lipitor 10 mg daily.  He denies myalgia or fatigue Lab Results  Component Value Date   CHOL 216 (H) 12/01/2020   HDL 61.10 12/01/2020   LDLCALC 135 (H) 12/01/2020   LDLDIRECT 168.0 12/03/2017   TRIG 101.0 12/01/2020   CHOLHDL 4 12/01/2020   History of pulmonary embolism-takes Xarelto 20 mg daily.  He was on lifelong anticoagulation  All immunizations and health maintenance protocols were reviewed with the patient and needed orders were placed.  Appropriate screening laboratory values were ordered for the patient including screening of hyperlipidemia, renal function and hepatic function. If indicated by BPH, a PSA was ordered.  Medication reconciliation,  past medical history, social history, problem list and allergies were reviewed in detail with the patient  Goals were established with regard to weight loss,  exercise, and  diet in compliance with medications. He has not been exercising due to working a lot at work and has not been eating healthy.  Wt Readings from Last 3 Encounters:  07/16/22 231 lb 9.6 oz (105.1 kg)  12/01/20 230 lb (104.3 kg)  08/25/20 230 lb 8 oz (104.6 kg)    He is up-to-date on routine colon cancer screening  Review of Systems  Constitutional: Negative.   HENT: Negative.    Eyes: Negative.   Respiratory: Negative.    Cardiovascular: Negative.   Gastrointestinal: Negative.   Endocrine: Negative.   Genitourinary: Negative.   Musculoskeletal: Negative.   Skin: Negative.   Allergic/Immunologic: Negative.   Neurological: Negative.   Hematological: Negative.   Psychiatric/Behavioral: Negative.    All other systems reviewed and are negative.  Past Medical History:  Diagnosis Date   Diabetes type 2, controlled (HCC)    DVT (deep venous thrombosis) (HCC)    had LLE dvt in 1997; no clear provocation.    Hematuria    neg work up with Urology.    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, engineering    Social Determinants of Corporate investment banker Strain: Not on file  Food Insecurity: Not on file  Transportation Needs: Not on file  Physical Activity: Not on file  Stress: Not on  file  Social Connections: Not on file  Intimate Partner Violence: Not on file    Past Surgical History:  Procedure Laterality Date   NO PAST SURGERIES      Family History  Problem Relation Age of Onset   Heart failure Father    Clotting disorder Father    Stroke Mother    Aneurysm Mother        brain   Hypertension Sister    Diabetes Sister    Hypertension Brother    Diabetes Brother    Colon cancer Neg Hx    Esophageal cancer Neg Hx    Rectal cancer Neg Hx     Allergies  Allergen Reactions   Shellfish Allergy     REACTION: throat swells   Shrimp Flavor     REACTION: throat swells    Current Outpatient Medications on File Prior to Visit  Medication Sig Dispense Refill   amLODipine (NORVASC) 5 MG tablet TAKE 1 TABLET DAILY 90 tablet 3   glipiZIDE (GLUCOTROL XL) 10 MG 24 hr tablet TAKE 1 TABLET DAILY WITH BREAKFAST 90 tablet 3   JARDIANCE 25 MG TABS tablet TAKE 1 TABLET DAILY BEFORE BREAKFAST 90 tablet 3   lisinopril-hydrochlorothiazide (ZESTORETIC) 20-12.5 MG tablet TAKE 1 TABLET DAILY 90 tablet 3   metFORMIN (GLUCOPHAGE) 1000 MG tablet TAKE 1 TABLET TWICE A DAY WITH A MEAL 180 tablet 3   Multiple Vitamins-Minerals (MULTIVITAMIN WITH MINERALS) tablet Take 1 tablet by mouth daily.     XARELTO 20 MG TABS tablet TAKE 1 TABLET DAILY 90 tablet 3   atorvastatin (LIPITOR) 10 MG tablet Take 1 tablet (10 mg total) by mouth daily. (Patient not taking: Reported on 07/16/2022) 90 tablet 3   glucose blood (ACCU-CHEK AVIVA PLUS) test strip Use as instructed (Patient not taking: Reported on 07/16/2022) 100 each 12   No current facility-administered medications on file prior to visit.    BP (!) 140/80 (BP Location: Left Arm, Patient Position: Sitting, Cuff Size: Normal)   Pulse 85   Temp 98 F (36.7 C) (Oral)   Ht 6' (1.829 m)   Wt 231 lb 9.6 oz (105.1 kg)   SpO2 98%   BMI 31.41 kg/m       Objective:   Physical Exam Vitals and nursing note reviewed.  Constitutional:       General: He is not in acute distress.    Appearance: Normal appearance. He is well-developed and normal weight.  HENT:     Head: Normocephalic and atraumatic.     Right Ear: Tympanic membrane, ear canal and external ear normal. There is no impacted cerumen.     Left Ear: Tympanic membrane, ear canal and external ear normal. There is no impacted cerumen.     Nose: Nose normal. No congestion or rhinorrhea.     Mouth/Throat:     Mouth: Mucous membranes are moist.     Pharynx: Oropharynx is clear.  No oropharyngeal exudate or posterior oropharyngeal erythema.  Eyes:     General:        Right eye: No discharge.        Left eye: No discharge.     Extraocular Movements: Extraocular movements intact.     Conjunctiva/sclera: Conjunctivae normal.     Pupils: Pupils are equal, round, and reactive to light.  Neck:     Vascular: No carotid bruit.     Trachea: No tracheal deviation.  Cardiovascular:     Rate and Rhythm: Normal rate and regular rhythm.     Pulses: Normal pulses.     Heart sounds: Normal heart sounds. No murmur heard.    No friction rub. No gallop.  Pulmonary:     Effort: Pulmonary effort is normal. No respiratory distress.     Breath sounds: Normal breath sounds. No stridor. No wheezing, rhonchi or rales.  Chest:     Chest wall: No tenderness.  Abdominal:     General: Bowel sounds are normal. There is no distension.     Palpations: Abdomen is soft. There is no mass.     Tenderness: There is no abdominal tenderness. There is no right CVA tenderness, left CVA tenderness, guarding or rebound.     Hernia: No hernia is present.  Musculoskeletal:        General: No swelling, tenderness, deformity or signs of injury. Normal range of motion.     Right lower leg: No edema.     Left lower leg: No edema.  Lymphadenopathy:     Cervical: No cervical adenopathy.  Skin:    General: Skin is warm and dry.     Capillary Refill: Capillary refill takes less than 2 seconds.     Coloration:  Skin is not jaundiced or pale.     Findings: No bruising, erythema, lesion or rash.  Neurological:     General: No focal deficit present.     Mental Status: He is alert and oriented to person, place, and time.     Cranial Nerves: No cranial nerve deficit.     Sensory: No sensory deficit.     Motor: No weakness.     Coordination: Coordination normal.     Gait: Gait normal.     Deep Tendon Reflexes: Reflexes normal.  Psychiatric:        Mood and Affect: Mood normal.        Behavior: Behavior normal.        Thought Content: Thought content normal.        Judgment: Judgment normal.       Assessment & Plan:  1. Routine general medical examination at a health care facility - Work on weight loss through diet and exercise - Follow up in one year or sooner if needed - CBC with Differential/Platelet; Future - Comprehensive metabolic panel; Future - Hemoglobin A1c; Future - Lipid panel; Future - TSH; Future - Microalbumin/Creatinine Ratio, Urine; Future  2. Essential hypertension - Will have him check at home and send me his results via mychart  - CBC with Differential/Platelet; Future - Comprehensive metabolic panel; Future - Hemoglobin A1c; Future - Lipid panel; Future - TSH; Future - Microalbumin/Creatinine Ratio, Urine; Future  3. Prostate cancer screening  - PSA; Future  4. Uncontrolled type 2 diabetes mellitus with hyperglycemia ([place]) - Consider increasing Glipizide.  - Follow up in 3 months - Work on increasing diet and exercise  - CBC with Differential/Platelet; Future - Comprehensive metabolic panel; Future - Hemoglobin  A1c; Future - Lipid panel; Future - TSH; Future - Microalbumin/Creatinine Ratio, Urine; Future  5. Chronic anticoagulation - Continue with Xarelto   6. Acute pulmonary embolism, unspecified pulmonary embolism type, unspecified whether acute cor pulmonale present (HCC) - Continue Xarelto  - CBC with Differential/Platelet; Future - Comprehensive  metabolic panel; Future - Hemoglobin A1c; Future - Lipid panel; Future - TSH; Future  7. Mixed hyperlipidemia - Consider increase in statin  - CBC with Differential/Platelet; Future - Comprehensive metabolic panel; Future - Hemoglobin A1c; Future - Lipid panel; Future - TSH; Future  8. Need for hepatitis C screening test  - Hep C Antibody; Future  Shirline Frees, NP

## 2022-07-17 LAB — HEPATITIS C ANTIBODY: Hepatitis C Ab: NONREACTIVE

## 2022-07-31 ENCOUNTER — Other Ambulatory Visit: Payer: Self-pay | Admitting: Adult Health

## 2022-07-31 DIAGNOSIS — E1165 Type 2 diabetes mellitus with hyperglycemia: Secondary | ICD-10-CM

## 2022-10-17 ENCOUNTER — Ambulatory Visit (INDEPENDENT_AMBULATORY_CARE_PROVIDER_SITE_OTHER): Payer: BC Managed Care – PPO | Admitting: Adult Health

## 2022-10-17 ENCOUNTER — Encounter: Payer: Self-pay | Admitting: Adult Health

## 2022-10-17 VITALS — BP 130/80 | HR 98 | Temp 98.1°F | Ht 72.0 in | Wt 239.0 lb

## 2022-10-17 DIAGNOSIS — Z23 Encounter for immunization: Secondary | ICD-10-CM | POA: Diagnosis not present

## 2022-10-17 DIAGNOSIS — E1165 Type 2 diabetes mellitus with hyperglycemia: Secondary | ICD-10-CM | POA: Diagnosis not present

## 2022-10-17 DIAGNOSIS — I10 Essential (primary) hypertension: Secondary | ICD-10-CM

## 2022-10-17 LAB — POCT GLYCOSYLATED HEMOGLOBIN (HGB A1C): Hemoglobin A1C: 7.4 % — AB (ref 4.0–5.6)

## 2022-10-17 MED ORDER — EMPAGLIFLOZIN 25 MG PO TABS: 25.0000 mg | ORAL_TABLET | Freq: Every day | ORAL | 0 refills | Status: DC

## 2022-10-17 NOTE — Patient Instructions (Addendum)
It was great seeing you today  Your A1c improved to 7.4 - keep working hard  I will see you back in 3 months

## 2022-10-17 NOTE — Progress Notes (Signed)
Subjective:    Patient ID: Joshua Castaneda, male    DOB: 1966/09/04, 56 y.o.   MRN: [id number]  HPI 56 year old male who  has a past medical history of Diabetes type 2, controlled (HCC), DVT (deep venous thrombosis) (HCC), Hematuria, Hypertension, Metabolic syndrome, and Pulmonary embolism (HCC).  He presents to the office today for follow-up regarding diabetes mellitus and hypertension  Diabetes mellitus type 2-he is currently managed with Glipizide 10 mg ER daily, Jardiance 25 mg daily, and metformin 1000 mg twice daily. He reports today that he has been working on diet and exercise. He has been working hard over the last three months.   Lab Results  Component Value Date   HGBA1C 9.8 (H) 07/16/2022   Hypertension -managed with Norvasc 5 mg daily, lisinopril/hydrochlorothiazide 20-12.5 mg daily.  He denies dizziness, lightheadedness, chest pain, or syncopal episodes.  He does not check his blood pressure at home BP Readings from Last 3 Encounters:  10/17/22 130/80  07/16/22 (!) 140/80  12/01/20 (!) 140/96     Review of Systems See HPI   Past Medical History:  Diagnosis Date   Diabetes type 2, controlled (HCC)    DVT (deep venous thrombosis) (HCC)    had LLE dvt in 1997; no clear provocation.    Hematuria    neg work up with Urology.    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, engineering    Social Determinants of Corporate investment banker Strain: Not on file  Food Insecurity: Not on file  Transportation Needs: Not on file  Physical Activity: Not on file  Stress: Not on file  Social Connections: Not on file  Intimate Partner Violence: Not on file    Past Surgical History:  Procedure Laterality Date   NO PAST SURGERIES      Family History  Problem Relation Age of Onset   Heart failure Father    Clotting disorder Father    Stroke Mother    Aneurysm Mother        brain   Hypertension Sister    Diabetes Sister    Hypertension Brother    Diabetes Brother    Colon cancer Neg Hx    Esophageal cancer Neg Hx    Rectal cancer Neg Hx     Allergies  Allergen Reactions   Shellfish Allergy     REACTION: throat swells   Shrimp Flavor     REACTION: throat swells    Current Outpatient Medications on File Prior to Visit  Medication Sig Dispense Refill   amLODipine (NORVASC) 5 MG tablet TAKE 1 TABLET DAILY 90 tablet 3  atorvastatin (LIPITOR) 40 MG tablet Take 1 tablet (40 mg total) by mouth daily. 90 tablet 3   Continuous Blood Gluc Sensor (FREESTYLE LIBRE 3 SENSOR) MISC 1 Device by Does not apply route every 14 (fourteen) days. Place 1 sensor on the skin every 14 days. Use to check glucose continuously 2 each 11   empagliflozin (JARDIANCE) 25 MG TABS tablet Take 1 tablet (25 mg total) by mouth daily before breakfast. 90 tablet 0   glipiZIDE (GLUCOTROL XL) 10 MG 24 hr tablet TAKE 1 TABLET DAILY WITH   BREAKFAST 90 tablet 3   glucose blood (ACCU-CHEK AVIVA PLUS) test strip Use as instructed 100 each 12   lisinopril-hydrochlorothiazide (ZESTORETIC) 20-12.5 MG tablet TAKE 1 TABLET DAILY 90 tablet 3   metFORMIN (GLUCOPHAGE) 1000 MG tablet TAKE 1 TABLET TWICE A DAY WITH A MEAL 180 tablet 3   Multiple Vitamins-Minerals (MULTIVITAMIN WITH MINERALS) tablet Take 1 tablet by  mouth daily.     rivaroxaban (XARELTO) 20 MG TABS tablet Take 1 tablet (20 mg total) by mouth daily. 90 tablet 3   No current facility-administered medications on file prior to visit.    BP 130/80   Pulse 98   Temp 98.1 F (36.7 C) (Oral)   Ht 6' (1.829 m)   Wt 239 lb (108.4 kg)   SpO2 99%   BMI 32.41 kg/m       Objective:   Physical Exam Vitals and nursing note reviewed.  Constitutional:      Appearance: Normal appearance.  Cardiovascular:     Rate and Rhythm: Normal rate and regular rhythm.     Pulses: Normal pulses.     Heart sounds: Normal heart sounds.  Pulmonary:     Effort: Pulmonary effort is normal.     Breath sounds: Normal breath sounds.  Musculoskeletal:        General: Normal range of motion.  Skin:    General: Skin is warm and dry.     Capillary Refill: Capillary refill takes less than 2 seconds.  Neurological:     General: No focal deficit present.     Mental Status: He is alert and oriented to person, place, and time.  Psychiatric:        Mood and Affect: Mood normal.        Behavior: Behavior normal.        Thought Content: Thought content normal.        Judgment: Judgment normal.       Assessment & Plan:  1. Uncontrolled type 2 diabetes mellitus with hyperglycemia (HCC)  - POC HgB A1c- 7.4 - has improved  - Follow up in three months. No medication changes at this time  - empagliflozin (JARDIANCE) 25 MG TABS tablet; Take 1 tablet (25 mg total) by mouth daily before breakfast.  Dispense: 90 tablet; Refill: 0  2. Essential hypertension - Well controlled. No change in medications   Shirline Frees, NP

## 2022-12-04 ENCOUNTER — Other Ambulatory Visit: Payer: Self-pay | Admitting: Adult Health

## 2022-12-04 DIAGNOSIS — E1165 Type 2 diabetes mellitus with hyperglycemia: Secondary | ICD-10-CM

## 2023-01-20 ENCOUNTER — Other Ambulatory Visit: Payer: Self-pay | Admitting: Adult Health

## 2023-01-20 DIAGNOSIS — I2699 Other pulmonary embolism without acute cor pulmonale: Secondary | ICD-10-CM

## 2023-01-20 DIAGNOSIS — Z7901 Long term (current) use of anticoagulants: Secondary | ICD-10-CM

## 2023-01-20 DIAGNOSIS — I10 Essential (primary) hypertension: Secondary | ICD-10-CM

## 2023-11-04 ENCOUNTER — Other Ambulatory Visit: Payer: Self-pay | Admitting: Adult Health

## 2023-11-04 DIAGNOSIS — Z7901 Long term (current) use of anticoagulants: Secondary | ICD-10-CM

## 2023-11-04 DIAGNOSIS — I2699 Other pulmonary embolism without acute cor pulmonale: Secondary | ICD-10-CM

## 2023-11-04 DIAGNOSIS — E1165 Type 2 diabetes mellitus with hyperglycemia: Secondary | ICD-10-CM

## 2023-11-04 DIAGNOSIS — I10 Essential (primary) hypertension: Secondary | ICD-10-CM

## 2023-11-05 NOTE — Telephone Encounter (Signed)
 Patient need to schedule CPE for more refills.

## 2023-11-06 NOTE — Telephone Encounter (Signed)
Pt has cpe sch for 12-17-2023

## 2023-11-07 ENCOUNTER — Other Ambulatory Visit: Payer: Self-pay | Admitting: Adult Health

## 2023-11-07 DIAGNOSIS — Z7901 Long term (current) use of anticoagulants: Secondary | ICD-10-CM

## 2023-11-07 DIAGNOSIS — I2699 Other pulmonary embolism without acute cor pulmonale: Secondary | ICD-10-CM

## 2023-11-07 DIAGNOSIS — E1165 Type 2 diabetes mellitus with hyperglycemia: Secondary | ICD-10-CM

## 2023-11-07 MED ORDER — EMPAGLIFLOZIN 25 MG PO TABS
25.0000 mg | ORAL_TABLET | Freq: Every day | ORAL | 0 refills | Status: DC
Start: 2023-11-07 — End: 2023-11-08

## 2023-11-07 MED ORDER — RIVAROXABAN 20 MG PO TABS
20.0000 mg | ORAL_TABLET | Freq: Every day | ORAL | 0 refills | Status: DC
Start: 2023-11-07 — End: 2023-11-08

## 2023-11-07 NOTE — Telephone Encounter (Signed)
Copied from CRM [id number]. Topic: Clinical - Medication Refill >> Nov 07, 2023  9:47 AM Roberto Scales wrote: Most Recent Primary Care Visit:  Provider: Shirline Frees  Department: [facility]  Visit Type: OFFICE VISIT  Date: 10/17/2022  Medication: ***  Has the patient contacted their pharmacy?  (Agent: If no, request that the patient contact the pharmacy for the refill. If patient does not wish to contact the pharmacy document the reason why and proceed with request.) (Agent: If yes, when and what did the pharmacy advise?)  Is this the correct pharmacy for this prescription?  If no, delete pharmacy and type the correct one.  This is the patient's preferred pharmacy:  [id number] - [place], [place] - [address] AT [facility] [address] [place] [address] Phone: [phone] Fax: [phone]  [facility] - [place], [place] - [address] [place] [address] Phone: [phone] Fax: [phone]  [facility] - [place], [place] - [address] [facility] [address] AT Portal to Registered [facility] Sites [address] [place] [address] Phone: [phone] Fax: [phone]  [id number] [place], [place] - [address] [place] [address] Phone: [phone] Fax: [phone]   Has the prescription been filled recently?   Is the patient out of the medication?   Has the patient been seen for an appointment in the last year OR does the patient have an upcoming appointment?   Can we respond through MyChart?   Agent: Please be advised that Rx refills may take up to 3 business days. We ask that you follow-up with your pharmacy.

## 2023-11-08 ENCOUNTER — Other Ambulatory Visit: Payer: Self-pay | Admitting: Adult Health

## 2023-11-08 DIAGNOSIS — Z7901 Long term (current) use of anticoagulants: Secondary | ICD-10-CM

## 2023-11-08 DIAGNOSIS — E1165 Type 2 diabetes mellitus with hyperglycemia: Secondary | ICD-10-CM

## 2023-11-08 DIAGNOSIS — I2699 Other pulmonary embolism without acute cor pulmonale: Secondary | ICD-10-CM

## 2023-11-10 ENCOUNTER — Encounter: Payer: Self-pay | Admitting: Adult Health

## 2023-11-10 NOTE — Telephone Encounter (Signed)
[facility] [address] [facility]

## 2023-11-11 MED ORDER — EMPAGLIFLOZIN 25 MG PO TABS
25.0000 mg | ORAL_TABLET | Freq: Every day | ORAL | 0 refills | Status: DC
Start: 2023-11-11 — End: 2023-12-17

## 2023-11-11 MED ORDER — RIVAROXABAN 20 MG PO TABS
20.0000 mg | ORAL_TABLET | Freq: Every day | ORAL | 0 refills | Status: DC
Start: 2023-11-11 — End: 2023-11-12

## 2023-11-11 NOTE — Telephone Encounter (Signed)
Noted  

## 2023-11-11 NOTE — Telephone Encounter (Signed)
Pt has not been seen since 2023. Pt has been scheduled for 12/17/2023. Ok to fill for 30 days?

## 2023-11-12 ENCOUNTER — Other Ambulatory Visit: Payer: Self-pay | Admitting: Adult Health

## 2023-11-12 DIAGNOSIS — Z7901 Long term (current) use of anticoagulants: Secondary | ICD-10-CM

## 2023-11-12 DIAGNOSIS — I2699 Other pulmonary embolism without acute cor pulmonale: Secondary | ICD-10-CM

## 2023-11-12 DIAGNOSIS — E1165 Type 2 diabetes mellitus with hyperglycemia: Secondary | ICD-10-CM

## 2023-11-12 DIAGNOSIS — I10 Essential (primary) hypertension: Secondary | ICD-10-CM

## 2023-11-12 NOTE — Telephone Encounter (Signed)
Copied from CRM [id number]. Topic: Clinical - Medication Refill >> Nov 12, 2023  4:23 PM Sonny Dandy B wrote: Most Recent Primary Care Visit:  Provider: Shirline Frees  Department: [facility]  Visit Type: OFFICE VISIT  Date: 10/17/2022  Medication: rivaroxaban (XARELTO) 20 MG TABS tablet,   Has the patient contacted their pharmacy? Yes (Agent: If no, request that the patient contact the pharmacy for the refill. If patient does not wish to contact the pharmacy document the reason why and proceed with request.) (Agent: If yes, when and what did the pharmacy advise?)  Is this the correct pharmacy for this prescription? Yes If no, delete pharmacy and type the correct one.  This is the patient's preferred pharmacy:  [id number] [place], [place] - [address] AT [facility] [address] [facility] [address] [facility] [address] [place] [address] Phone: [phone] Fax: [phone]    [id number] [place], [place] - [address] [place] [address] Phone: [phone] Fax: [phone]   Has the prescription been filled recently? Yes  Is the patient out of the medication? Yes  Has the patient been seen for an appointment in the last year OR does the patient have an upcoming appointment? Yes  Can we respond through MyChart? Yes  Agent: Please be advised that Rx refills may take up to 3 business days. We ask that you follow-up with your pharmacy.

## 2023-11-12 NOTE — Telephone Encounter (Signed)
Copied from CRM [id number]. Topic: Clinical - Medication Refill >> Nov 12, 2023  4:25 PM Sonny Dandy B wrote: Most Recent Primary Care Visit:  Provider: Shirline Frees  Department: [facility]  Visit Type: OFFICE VISIT  Date: 10/17/2022  Medication: empagliflozin (JARDIANCE) 25 MG TABS tablet, for 90 day suppy,  glipiZIDE (GLUCOTROL XL) 10 MG 24 hr tablet 90day  supply , lisinopril-hydrochlorothiazide (ZESTORETIC) 20-12.5 MG tablet, metFORMIN (GLUCOPHAGE) 1000 MG tablet  180 for 90 days All medications are 90 day supply Has the patient contacted their pharmacy? Yes (Agent: If no, request that the patient contact the pharmacy for the refill. If patient does not wish to contact the pharmacy document the reason why and proceed with request.) (Agent: If yes, when and what did the pharmacy advise?)  Is this the correct pharmacy for this prescription? Yes If no, delete pharmacy and type the correct one.  This is the patient's preferred pharmacy:      [facility] - [place], [place] - [address] [facility] AT Portal to Registered [facility] Sites [address] [place] [address] Phone: [phone] Fax: [phone]    Has the prescription been filled recently? Yes  Is the patient out of the medication? Yes  Has the patient been seen for an appointment in the last year OR does the patient have an upcoming appointment? Yes  Can we respond through MyChart? Yes  Agent: Please be advised that Rx refills may take up to 3 business days. We ask that you follow-up with your pharmacy.

## 2023-11-13 MED ORDER — RIVAROXABAN 20 MG PO TABS: 20.0000 mg | ORAL_TABLET | Freq: Every day | ORAL | 0 refills | Status: DC

## 2023-11-13 MED ORDER — LISINOPRIL-HYDROCHLOROTHIAZIDE 20-12.5 MG PO TABS: 1.0000 | ORAL_TABLET | Freq: Every day | ORAL | 0 refills | Status: DC

## 2023-11-13 MED ORDER — METFORMIN HCL 1000 MG PO TABS: ORAL_TABLET | ORAL | 0 refills | Status: DC

## 2023-11-13 MED ORDER — GLIPIZIDE ER 10 MG PO TB24: 10.0000 mg | ORAL_TABLET | Freq: Every day | ORAL | 0 refills | Status: DC

## 2023-12-16 ENCOUNTER — Ambulatory Visit: Payer: BC Managed Care – PPO | Admitting: Adult Health

## 2023-12-16 ENCOUNTER — Other Ambulatory Visit: Payer: Self-pay | Admitting: Adult Health

## 2023-12-16 DIAGNOSIS — I10 Essential (primary) hypertension: Secondary | ICD-10-CM

## 2023-12-17 ENCOUNTER — Other Ambulatory Visit: Payer: Self-pay | Admitting: Adult Health

## 2023-12-17 ENCOUNTER — Encounter: Payer: Self-pay | Admitting: Adult Health

## 2023-12-17 ENCOUNTER — Ambulatory Visit (INDEPENDENT_AMBULATORY_CARE_PROVIDER_SITE_OTHER): Payer: BC Managed Care – PPO | Admitting: Adult Health

## 2023-12-17 VITALS — BP 130/80 | HR 76 | Temp 98.1°F | Ht 73.0 in | Wt 239.6 lb

## 2023-12-17 DIAGNOSIS — I10 Essential (primary) hypertension: Secondary | ICD-10-CM | POA: Diagnosis not present

## 2023-12-17 DIAGNOSIS — Z23 Encounter for immunization: Secondary | ICD-10-CM | POA: Diagnosis not present

## 2023-12-17 DIAGNOSIS — Z125 Encounter for screening for malignant neoplasm of prostate: Secondary | ICD-10-CM

## 2023-12-17 DIAGNOSIS — E119 Type 2 diabetes mellitus without complications: Secondary | ICD-10-CM | POA: Diagnosis not present

## 2023-12-17 DIAGNOSIS — Z7984 Long term (current) use of oral hypoglycemic drugs: Secondary | ICD-10-CM

## 2023-12-17 DIAGNOSIS — E782 Mixed hyperlipidemia: Secondary | ICD-10-CM | POA: Diagnosis not present

## 2023-12-17 DIAGNOSIS — I2699 Other pulmonary embolism without acute cor pulmonale: Secondary | ICD-10-CM

## 2023-12-17 DIAGNOSIS — Z0000 Encounter for general adult medical examination without abnormal findings: Secondary | ICD-10-CM | POA: Diagnosis not present

## 2023-12-17 LAB — LIPID PANEL
Cholesterol: 214 mg/dL — ABNORMAL HIGH (ref 0–200)
HDL: 66.2 mg/dL (ref >39.00)
LDL Cholesterol: 135 mg/dL — ABNORMAL HIGH (ref 0–99)
NonHDL: 148.17
Total CHOL/HDL Ratio: 3
Triglycerides: 67 mg/dL (ref 0.0–149.0)
VLDL: 13.4 mg/dL (ref 0.0–40.0)

## 2023-12-17 LAB — COMPREHENSIVE METABOLIC PANEL
ALT: 11 U/L (ref 0–53)
AST: 14 U/L (ref 0–37)
Albumin: 4 g/dL (ref 3.5–5.2)
Alkaline Phosphatase: 64 U/L (ref 39–117)
BUN: 14 mg/dL (ref 6–23)
CO2: 27 meq/L (ref 19–32)
Calcium: 9.1 mg/dL (ref 8.4–10.5)
Chloride: 101 meq/L (ref 96–112)
Creatinine, Ser: 1.24 mg/dL (ref 0.40–1.50)
GFR: 64.77 mL/min (ref >60.00)
Glucose, Bld: 166 mg/dL — ABNORMAL HIGH (ref 70–99)
Potassium: 4.1 meq/L (ref 3.5–5.1)
Sodium: 138 meq/L (ref 135–145)
Total Bilirubin: 0.6 mg/dL (ref 0.2–1.2)
Total Protein: 6.9 g/dL (ref 6.0–8.3)

## 2023-12-17 LAB — CBC
HCT: 38.3 % — ABNORMAL LOW (ref 39.0–52.0)
Hemoglobin: 12.6 g/dL — ABNORMAL LOW (ref 13.0–17.0)
MCHC: 32.9 g/dL (ref 30.0–36.0)
MCV: 85.4 fl (ref 78.0–100.0)
Platelets: 336 10*3/uL (ref 150.0–400.0)
RBC: 4.48 Mil/uL (ref 4.22–5.81)
RDW: 14.4 % (ref 11.5–15.5)
WBC: 3.8 10*3/uL — ABNORMAL LOW (ref 4.0–10.5)

## 2023-12-17 LAB — TSH: TSH: 1.16 u[IU]/mL (ref 0.35–5.50)

## 2023-12-17 LAB — MICROALBUMIN / CREATININE URINE RATIO
Creatinine,U: 124.2 mg/dL
Microalb Creat Ratio: 27.3 mg/g (ref 0.0–30.0)
Microalb, Ur: 3.4 mg/dL — ABNORMAL HIGH (ref 0.0–1.9)

## 2023-12-17 LAB — HEMOGLOBIN A1C: Hgb A1c MFr Bld: 11.3 % — ABNORMAL HIGH (ref 4.6–6.5)

## 2023-12-17 LAB — PSA: PSA: 3.39 ng/mL (ref 0.10–4.00)

## 2023-12-17 MED ORDER — RIVAROXABAN 20 MG PO TABS: 20.0000 mg | ORAL_TABLET | Freq: Every day | ORAL | 3 refills | Status: DC

## 2023-12-17 MED ORDER — EMPAGLIFLOZIN 25 MG PO TABS: 25.0000 mg | ORAL_TABLET | Freq: Every day | ORAL | 0 refills | Status: DC

## 2023-12-17 MED ORDER — METFORMIN HCL 1000 MG PO TABS: ORAL_TABLET | ORAL | 0 refills | Status: DC

## 2023-12-17 MED ORDER — GLIPIZIDE ER 10 MG PO TB24: 10.0000 mg | ORAL_TABLET | Freq: Every day | ORAL | 0 refills | Status: DC

## 2023-12-17 MED ORDER — ATORVASTATIN CALCIUM 40 MG PO TABS: 40.0000 mg | ORAL_TABLET | Freq: Every day | ORAL | 3 refills | Status: AC

## 2023-12-17 MED ORDER — LISINOPRIL-HYDROCHLOROTHIAZIDE 20-12.5 MG PO TABS: 1.0000 | ORAL_TABLET | Freq: Every day | ORAL | 3 refills | Status: AC

## 2023-12-17 MED ORDER — FREESTYLE LIBRE 3 PLUS SENSOR MISC: 6 refills | Status: DC

## 2023-12-17 MED ORDER — AMLODIPINE BESYLATE 5 MG PO TABS: 5.0000 mg | ORAL_TABLET | Freq: Every day | ORAL | 3 refills | Status: AC

## 2023-12-17 NOTE — Progress Notes (Signed)
 Subjective:    Patient ID: Joshua Castaneda, male    DOB: [birth year], 58 y.o.   MRN: [id number]  HPI Patient presents for yearly preventative medicine examination. He is a 58 year old male who  has a past medical history of Diabetes type 2, controlled (HCC), DVT (deep venous thrombosis) (HCC), Hematuria, Hypertension, Metabolic syndrome, and Pulmonary embolism (HCC).  Diabetes mellitus type 2- he is currently managed with Glipizide 10 mg ER daily, Jardiance 25 mg daily, and metformin 1000 mg twice daily. He reports today that he has been working on diet and exercise. He has not been checking his BS at home. Recently ran out of [place].  Lab Results  Component Value Date   HGBA1C 7.4 (A) 10/17/2022   HGBA1C 9.8 (H) 07/16/2022   HGBA1C 7.1 (H) 12/01/2020   Hyperlipidemia - has not been taking his lipitor.  Lab Results  Component Value Date   CHOL 265 (H) 07/16/2022   HDL 74.50 07/16/2022   LDLCALC 164 (H) 07/16/2022   LDLDIRECT 168.0 12/03/2017   TRIG 134.0 07/16/2022   CHOLHDL 4 07/16/2022   History of pulmonary embolism-takes Xarelto 20 mg daily.  He was on lifelong anticoagulation  Hypertension -managed with Norvasc 5 mg daily, lisinopril/hydrochlorothiazide 20-12.5 mg daily.  He denies dizziness, lightheadedness, chest pain, or syncopal episodes.  He does not check his blood pressure at home BP Readings from Last 3 Encounters:  12/17/23 130/80  10/17/22 130/80  07/16/22 (!) 140/80    All immunizations and health maintenance protocols were reviewed with the patient and needed orders were placed.  Appropriate screening laboratory values were ordered for the patient including screening of hyperlipidemia, renal function and hepatic function. If indicated by BPH, a PSA was ordered.  Medication reconciliation,  past medical history, social history, problem list and allergies were reviewed in detail with the patient  Goals were established with regard to weight loss, exercise,  and  diet in compliance with medications. He does try and stay active and eat healthy.   Wt Readings from Last 3 Encounters:  12/17/23 239 lb 9.6 oz (108.7 kg)  10/17/22 239 lb (108.4 kg)  07/16/22 231 lb 9.6 oz (105.1 kg)     He is up to date on routine colon cancer screening   Review of Systems  Constitutional: Negative.   HENT: Negative.    Eyes: Negative.   Respiratory: Negative.    Cardiovascular: Negative.   Gastrointestinal: Negative.   Endocrine: Negative.   Genitourinary: Negative.   Musculoskeletal: Negative.   Skin: Negative.   Allergic/Immunologic: Negative.   Neurological: Negative.   Hematological: Negative.   Psychiatric/Behavioral: Negative.    All other systems reviewed and are negative.  Past Medical History:  Diagnosis Date   Diabetes type 2, controlled (HCC)    DVT (deep venous thrombosis) (HCC)    had LLE dvt in 1997; no clear provocation.    Hematuria    neg work up with Urology.    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, engineering    Social Drivers of Corporate investment banker Strain: Not on file  Food Insecurity: Not on file  Transportation Needs: Not on file  Physical Activity: Not on file  Stress: Not on file  Social  Connections: Not on file  Intimate Partner Violence: Not on file    Past Surgical History:  Procedure Laterality Date   NO PAST SURGERIES      Family History  Problem Relation Age of Onset   Heart failure Father    Clotting disorder Father    Stroke Mother    Aneurysm Mother        brain   Hypertension Sister    Diabetes Sister    Hypertension Brother    Diabetes Brother    Colon cancer Neg Hx    Esophageal cancer Neg Hx    Rectal cancer Neg Hx     Allergies  Allergen Reactions   Shellfish Allergy     REACTION: throat swells   Shrimp Flavor Agent (Non-Screening)     REACTION: throat swells    Current Outpatient Medications on File Prior to Visit  Medication Sig Dispense Refill   amLODipine (NORVASC) 5 MG tablet TAKE 1 TABLET DAILY 90 tablet 1   atorvastatin (LIPITOR) 40 MG tablet Take 1 tablet (40 mg total) by mouth daily. 90 tablet 3   Continuous Blood Gluc Sensor (FREESTYLE LIBRE 3 SENSOR) MISC 1 Device by Does not apply route every 14 (fourteen) days. Place 1 sensor on the skin every 14 days. Use to check glucose continuously 2 each 11   empagliflozin (JARDIANCE) 25 MG TABS tablet Take 1 tablet (25 mg total) by mouth daily before breakfast. 30 tablet 0   glipiZIDE (GLUCOTROL XL) 10 MG 24 hr tablet Take 1 tablet (10 mg total) by mouth daily with breakfast. 30 tablet 0   glucose blood (ACCU-CHEK AVIVA PLUS) test strip Use as instructed 100 each 12   lisinopril-hydrochlorothiazide (ZESTORETIC) 20-12.5 MG tablet Take 1 tablet by mouth daily. 30 tablet 0   metFORMIN (GLUCOPHAGE) 1000 MG tablet TAKE 1 TABLET TWICE A DAY WITH A MEAL 30 tablet 0   Multiple Vitamins-Minerals (MULTIVITAMIN WITH MINERALS) tablet Take 1 tablet by mouth daily.     rivaroxaban (XARELTO) 20 MG TABS tablet Take 1 tablet (20 mg total) by mouth daily. 30 tablet 0   No current facility-administered medications on file prior to visit.    BP 130/80   Pulse 76   Temp 98.1 F (36.7 C) (Oral)   Ht 6'  1" (1.854 m)   Wt 239 lb 9.6 oz (108.7 kg)   SpO2 96%   BMI 31.61 kg/m       Objective:   Physical Exam Vitals and nursing note reviewed.  Constitutional:      General: He is not in acute distress.    Appearance: Normal appearance. He is not ill-appearing.  HENT:     Head: Normocephalic and atraumatic.     Right Ear: Tympanic membrane, ear canal and external ear normal. There is no impacted cerumen.  Left Ear: Tympanic membrane, ear canal and external ear normal. There is no impacted cerumen.     Nose: Nose normal. No congestion or rhinorrhea.     Mouth/Throat:     Mouth: Mucous membranes are moist.     Pharynx: Oropharynx is clear.  Eyes:     Extraocular Movements: Extraocular movements intact.     Conjunctiva/sclera: Conjunctivae normal.     Pupils: Pupils are equal, round, and reactive to light.  Neck:     Vascular: No carotid bruit.  Cardiovascular:     Rate and Rhythm: Normal rate and regular rhythm.     Pulses: Normal pulses.     Heart sounds: No murmur heard.    No friction rub. No gallop.  Pulmonary:     Effort: Pulmonary effort is normal.     Breath sounds: Normal breath sounds.  Abdominal:     General: Abdomen is flat. Bowel sounds are normal. There is no distension.     Palpations: Abdomen is soft. There is no mass.     Tenderness: There is no abdominal tenderness. There is no guarding or rebound.     Hernia: No hernia is present.  Musculoskeletal:        General: Normal range of motion.     Cervical back: Normal range of motion and neck supple.  Lymphadenopathy:     Cervical: No cervical adenopathy.  Skin:    General: Skin is warm and dry.     Capillary Refill: Capillary refill takes less than 2 seconds.  Neurological:     General: No focal deficit present.     Mental Status: He is alert and oriented to person, place, and time.  Psychiatric:        Mood and Affect: Mood normal.        Behavior: Behavior normal.        Thought Content: Thought  content normal.        Judgment: Judgment normal.       Assessment & Plan:  1. Routine general medical examination at a health care facility (Primary) Today patient counseled on age appropriate routine health concerns for screening and prevention, each reviewed and up to date or declined. Immunizations reviewed and up to date or declined. Labs ordered and reviewed. Risk factors for depression reviewed and negative. Hearing function and visual acuity are intact. ADLs screened and addressed as needed. Functional ability and level of safety reviewed and appropriate. Education, counseling and referrals performed based on assessed risks today. Patient provided with a copy of personalized plan for preventive services. - Continue to eat healthy and exercise  - Follow up in one year or sooner if needed - PNA vaccination given today.  - Needs to schedule diabetic eye exam   2. Diabetes mellitus treated with oral medication (HCC) - Stressed the importance of following up in 3 months  - Lipid panel; Future - TSH; Future - CBC; Future - Comprehensive metabolic panel; Future - Hemoglobin A1c; Future - Microalbumin/Creatinine Ratio, Urine; Future - empagliflozin (JARDIANCE) 25 MG TABS tablet; Take 1 tablet (25 mg total) by mouth daily before breakfast.  Dispense: 90 tablet; Refill: 0 - glipiZIDE (GLUCOTROL XL) 10 MG 24 hr tablet; Take 1 tablet (10 mg total) by mouth daily with breakfast.  Dispense: 90 tablet; Refill: 0 - metFORMIN (GLUCOPHAGE) 1000 MG tablet; TAKE 1 TABLET TWICE A DAY WITH A MEAL  Dispense: 30 tablet; Refill: 0  3. Mixed hyperlipidemia - Restart Lipitor  - Lipid panel; Future -  TSH; Future - CBC; Future - Comprehensive metabolic panel; Future  4. Acute pulmonary embolism, unspecified pulmonary embolism type, unspecified whether acute cor pulmonale present (HCC)  - Lipid panel; Future - TSH; Future - CBC; Future - Comprehensive metabolic panel; Future - rivaroxaban (XARELTO)  20 MG TABS tablet; Take 1 tablet (20 mg total) by mouth daily.  Dispense: 90 tablet; Refill: 3  5. Essential hypertension - Controlled.  - Lipid panel; Future - TSH; Future - CBC; Future - Comprehensive metabolic panel; Future - amLODipine (NORVASC) 5 MG tablet; Take 1 tablet (5 mg total) by mouth daily.  Dispense: 90 tablet; Refill: 3 - lisinopril-hydrochlorothiazide (ZESTORETIC) 20-12.5 MG tablet; Take 1 tablet by mouth daily.  Dispense: 90 tablet; Refill: 3 - rivaroxaban (XARELTO) 20 MG TABS tablet; Take 1 tablet (20 mg total) by mouth daily.  Dispense: 90 tablet; Refill: 3  6. Prostate cancer screening  - PSA; Future

## 2023-12-17 NOTE — Patient Instructions (Signed)
 It was great seeing you today   We will follow up with you regarding your lab work   Please let me know if you need anything

## 2023-12-18 ENCOUNTER — Other Ambulatory Visit: Payer: Self-pay | Admitting: Adult Health

## 2023-12-18 NOTE — Addendum Note (Signed)
 Addended by: Waymon Amato R on: 12/18/2023 07:40 AM   Modules accepted: Orders

## 2023-12-31 ENCOUNTER — Other Ambulatory Visit (HOSPITAL_COMMUNITY): Payer: Self-pay

## 2023-12-31 ENCOUNTER — Telehealth: Payer: Self-pay

## 2023-12-31 NOTE — Telephone Encounter (Signed)
 Pharmacy Patient Advocate Encounter   Received notification from CoverMyMeds that prior authorization for FreeStyle Libre 3 Plus Sensor is required/requested.   Insurance verification completed.   The patient is insured through [facility] .   Per test claim: PA required; PA submitted to above mentioned insurance via CoverMyMeds Key/confirmation #/EOC [id number] Status is pending

## 2023-12-31 NOTE — Telephone Encounter (Signed)
 Pharmacy Patient Advocate Encounter  Received notification from [facility] that Prior Authorization for FreeStyle Libre 3 Plus Sensor has been DENIED.  See denial reason below. No denial letter attached in CMM. Will attach denial letter to Media tab once received.   PA #/Case ID/Reference #: [id number]

## 2023-12-31 NOTE — Telephone Encounter (Signed)
 Patient notified of update  and verbalized understanding. Pt is wanting to know if Xarelto was denied or approved. I did not see a PA for this. Please advise

## 2024-01-01 ENCOUNTER — Other Ambulatory Visit: Payer: Self-pay

## 2024-01-01 ENCOUNTER — Telehealth: Payer: Self-pay

## 2024-01-01 ENCOUNTER — Other Ambulatory Visit (HOSPITAL_COMMUNITY): Payer: Self-pay

## 2024-01-01 DIAGNOSIS — I10 Essential (primary) hypertension: Secondary | ICD-10-CM

## 2024-01-01 DIAGNOSIS — I2699 Other pulmonary embolism without acute cor pulmonale: Secondary | ICD-10-CM

## 2024-01-01 MED ORDER — RIVAROXABAN 20 MG PO TABS
20.0000 mg | ORAL_TABLET | Freq: Every day | ORAL | 3 refills | Status: DC
Start: 2024-01-01 — End: 2024-02-11

## 2024-01-01 NOTE — Telephone Encounter (Signed)
 New Encounter has been or will be created for follow up. For additional info see Pharmacy Prior Auth telephone encounter from 01/01/24.

## 2024-01-01 NOTE — Telephone Encounter (Signed)
 Pharmacy Patient Advocate Encounter   Received notification from Pt Calls Messages that prior authorization for XARELTO is required/requested.   Insurance verification completed.   The patient is insured through  [employer]  .   Per test claim: Refill too soon. PA is not needed at this time. Medication was filled 12/11/23. Next eligible fill date is 01/03/24.

## 2024-01-01 NOTE — Telephone Encounter (Signed)
 Called pt no answer

## 2024-01-09 ENCOUNTER — Telehealth: Payer: Self-pay

## 2024-01-09 NOTE — Progress Notes (Signed)
 01/09/2024 Name: Joshua Castaneda MRN: [id number] DOB: [birth year]  Chief Complaint  Patient presents with   Diabetes   Medication Management    Joshua Castaneda is a 58 y.o. year old male who presented for a telephone visit.   They were referred to the pharmacist for assistance in managing diabetes (TNM)   Subjective:  Care Team: Primary Care Provider: Shirline Frees, NP ; Next Scheduled Visit: 03/17/24  Medication Access/Adherence  Current Pharmacy:  [facility] - [place], [place] - [address] [place] [address] Phone: [phone] Fax: [phone]  [id number] [place], [place] - [address] [place] [address] Phone: [phone] Fax: [phone]   Patient reports affordability concerns with their medications: No  Patient reports access/transportation concerns to their pharmacy: No  Patient reports adherence concerns with their medications:  No     Diabetes:  Current medications: Metformin 1000mg  BID, Glipizide 10mg  daily, Jardiance 10mg  daily Medications tried in the past: None  Reports compliance with meds since last PCP visit  Current glucose readings: Not checking, sensors too expensive, reports he "wants to go in blind" and see how meds did at next A1C check with PCP. Does have a way to check if he feels off though   Patient denies hypoglycemic s/sx including dizziness, shakiness, sweating. Patient denies hyperglycemic symptoms including polyuria, polydipsia, polyphagia, nocturia, neuropathy, blurred vision.  Current meal patterns:  -Working on cutting back on the carbs  Current medication access support: None   Objective:  Lab Results  Component Value Date   HGBA1C 11.3 (H) 12/17/2023    Lab Results  Component Value Date   CREATININE 1.24 12/17/2023   BUN 14 12/17/2023   NA 138 12/17/2023   K 4.1 12/17/2023   CL 101 12/17/2023   CO2 27 12/17/2023     Lab Results  Component Value Date   CHOL 214 (H) 12/17/2023   HDL 66.20 12/17/2023   LDLCALC 135 (H) 12/17/2023   LDLDIRECT 168.0 12/03/2017   TRIG 67.0 12/17/2023   CHOLHDL 3 12/17/2023    Medications Reviewed Today     Reviewed by Sherrill Raring, RPH (Pharmacist) on 01/09/24 at 1518  Med List Status: <None>   Medication Order Taking? Sig Documenting Provider Last Dose Status Informant  amLODipine (NORVASC) 5 MG tablet [id number] Yes Take 1 tablet (5 mg total) by mouth daily. Nafziger, Kandee Keen, NP Taking Active   atorvastatin (LIPITOR) 40 MG tablet [id number] Yes Take 1 tablet (40 mg total) by mouth daily. Nafziger, Kandee Keen, NP Taking Active   Continuous Glucose Sensor (FREESTYLE LIBRE 3 PLUS SENSOR) MISC [id number] No Change sensor every 15 days.  Patient not taking: Reported on 01/09/2024   Joshua Frees, NP Not Taking Active   empagliflozin (JARDIANCE) 25 MG TABS tablet [id number] Yes Take 1 tablet (25 mg total) by mouth daily before breakfast. Joshua Frees, NP Taking Active   glipiZIDE (GLUCOTROL XL) 10 MG 24 hr tablet [id number] Yes Take 1 tablet (10 mg total) by mouth daily with breakfast. Joshua Frees, NP Taking Active   glucose blood (ACCU-CHEK AVIVA PLUS) test strip [id number]  Use as instructed Nafziger, Kandee Keen, NP  Active   lisinopril-hydrochlorothiazide (ZESTORETIC) 20-12.5 MG tablet [id number] Yes Take 1 tablet by mouth daily. Nafziger, Kandee Keen, NP Taking Active   metFORMIN (GLUCOPHAGE) 1000 MG tablet [id number] Yes TAKE 1 TABLET TWICE A DAY WITH A MEAL Nafziger, Kandee Keen, NP Taking Active   Multiple Vitamins-Minerals (MULTIVITAMIN WITH  MINERALS) tablet [id number]  Take 1 tablet by mouth daily. [provider]  Active Self  rivaroxaban (XARELTO) 20 MG TABS tablet [id number] Yes Take 1 tablet (20 mg total) by mouth daily. Nafziger, Kandee Keen, NP Taking Active               Assessment/Plan:   Diabetes: - Currently uncontrolled - Reviewed long term cardiovascular and renal  outcomes of uncontrolled blood sugar - Reviewed goal A1c, goal fasting, and goal 2 hour post prandial glucose - Reviewed dietary modifications including low carb diet - Recommend to continue current medication therapy. Consider addition of ozempic (or rybelsus if patient does not want injection) at next PCP visit if A1C still not at goal <7 with med compliance. Test claim shows copay of $25/month. - Patient denies personal or family history of multiple endocrine neoplasia type 2, medullary thyroid cancer; personal history of pancreatitis or gallbladder disease. - Recommend to check glucose daily   Follow Up Plan: reach out as needed following PCP appt on 03/17/24  Sherrill Raring, PharmD Clinical Pharmacist [phone]

## 2024-02-11 ENCOUNTER — Other Ambulatory Visit: Payer: Self-pay | Admitting: Adult Health

## 2024-02-11 DIAGNOSIS — I2699 Other pulmonary embolism without acute cor pulmonale: Secondary | ICD-10-CM

## 2024-02-11 DIAGNOSIS — I10 Essential (primary) hypertension: Secondary | ICD-10-CM

## 2024-03-17 ENCOUNTER — Encounter: Payer: Self-pay | Admitting: Adult Health

## 2024-03-17 ENCOUNTER — Ambulatory Visit (INDEPENDENT_AMBULATORY_CARE_PROVIDER_SITE_OTHER): Payer: BC Managed Care – PPO | Admitting: Adult Health

## 2024-03-17 VITALS — BP 136/82 | HR 75 | Temp 98.5°F | Ht 73.0 in | Wt 236.0 lb

## 2024-03-17 DIAGNOSIS — I2699 Other pulmonary embolism without acute cor pulmonale: Secondary | ICD-10-CM

## 2024-03-17 DIAGNOSIS — E119 Type 2 diabetes mellitus without complications: Secondary | ICD-10-CM

## 2024-03-17 DIAGNOSIS — Z7984 Long term (current) use of oral hypoglycemic drugs: Secondary | ICD-10-CM | POA: Diagnosis not present

## 2024-03-17 DIAGNOSIS — I10 Essential (primary) hypertension: Secondary | ICD-10-CM

## 2024-03-17 LAB — POCT GLYCOSYLATED HEMOGLOBIN (HGB A1C): Hemoglobin A1C: 6.9 % — AB (ref 4.0–5.6)

## 2024-03-17 MED ORDER — METFORMIN HCL 1000 MG PO TABS: ORAL_TABLET | ORAL | 0 refills | Status: DC

## 2024-03-17 MED ORDER — RIVAROXABAN 20 MG PO TABS: 20.0000 mg | ORAL_TABLET | Freq: Every day | ORAL | 3 refills | Status: DC

## 2024-03-17 MED ORDER — GLIPIZIDE ER 10 MG PO TB24: 10.0000 mg | ORAL_TABLET | Freq: Every day | ORAL | 0 refills | Status: DC

## 2024-03-17 MED ORDER — EMPAGLIFLOZIN 25 MG PO TABS: 25.0000 mg | ORAL_TABLET | Freq: Every day | ORAL | 0 refills | Status: DC

## 2024-03-17 NOTE — Progress Notes (Signed)
 Subjective:    Patient ID: Joshua Castaneda, male    DOB: July 26, 1966, 58 y.o.   MRN: [id number]  HPI  58 year old male who  has a past medical history of Diabetes type 2, controlled (HCC), DVT (deep venous thrombosis) (HCC), Hematuria, Hypertension, Metabolic syndrome, and Pulmonary embolism (HCC).  He presents to the office today for three month follow up regarding DM and HTN  Diabetes mellitus type 2- he is currently managed with Glipizide  10 mg ER daily, Jardiance  25 mg daily, and metformin  1000 mg twice daily. He reports today that he has been working on diet, smaller portion controls and less cabs and sugars he is also  walking almost every day.  Lab Results  Component Value Date   HGBA1C 11.3 (H) 12/17/2023   HGBA1C 7.4 (A) 10/17/2022   HGBA1C 9.8 (H) 07/16/2022   Hypertension -managed with Norvasc  5 mg daily, lisinopril /hydrochlorothiazide  20-12.5 mg daily.  He denies dizziness, lightheadedness, chest pain, or syncopal episodes.  He does not check his blood pressure at home BP Readings from Last 3 Encounters:  03/17/24 136/82  12/17/23 130/80  10/17/22 130/80   Review of Systems See HPI   Past Medical History:  Diagnosis Date   Diabetes type 2, controlled (HCC)    DVT (deep venous thrombosis) (HCC)    had LLE dvt in 1997; no clear provocation.    Hematuria    neg work up with Urology.    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   Tobacco Use   Smoking status: Never   Smokeless tobacco: Never  Substance and Sexual Activity   Alcohol use: Yes    Comment: occasional wine   Drug use: No   Sexual activity: Not on file  Other  Topics Concern   Not on file  Social History Narrative   ** Merged History Encounter ** [employer]ks with [employer] [place]  Has wife and kids live in [place]    Social Drivers of Health   Financial Resource Strain: Not on file  Food Insecurity: Not on file  Transportation Needs: Not on file  Physical Activity: Not on file  Stress: Not on file  Social Connections: Not on file  Intimate Partner Violence: Not on file    Past Surgical History:  Procedure Laterality Date   NO PAST SURGERIES      Family History  Problem Relation Age of Onset   Heart failure Father    Clotting disorder Father    Stroke Mother    Aneurysm Mother        brain   Hypertension Sister    Diabetes Sister    Hypertension Brother    Diabetes Brother    Colon cancer Neg Hx    Esophageal cancer Neg Hx    Rectal cancer Neg Hx     Allergies  Allergen Reactions   Shellfish Allergy     REACTION: throat swells   Shrimp Flavor Agent (Non-Screening)     REACTION: throat swells    Current Outpatient Medications on File Prior to Visit  Medication Sig Dispense Refill   amLODipine  (NORVASC ) 5 MG tablet Take 1 tablet (5 mg total) by mouth daily. 90 tablet 3   atorvastatin  (LIPITOR) 40 MG tablet Take 1 tablet (40 mg total) by mouth daily. 90 tablet 3   Continuous Glucose Sensor (FREESTYLE LIBRE 3 PLUS SENSOR) MISC Change sensor every 15 days. 2 each 6   empagliflozin  (JARDIANCE ) 25 MG TABS tablet Take 1 tablet (25 mg total) by mouth daily before breakfast. 90 tablet 0   glipiZIDE  (GLUCOTROL  XL) 10 MG 24 hr tablet Take 1 tablet (10 mg total) by mouth daily with breakfast. 90 tablet 0   glucose blood (ACCU-CHEK AVIVA PLUS) test strip Use as instructed 100 each 12   lisinopril -hydrochlorothiazide  (ZESTORETIC ) 20-12.5 MG tablet Take 1 tablet by mouth daily. 90 tablet 3   metFORMIN  (GLUCOPHAGE ) 1000 MG tablet TAKE 1 TABLET TWICE A DAY WITH A MEAL 30 tablet 0   Multiple Vitamins-Minerals  (MULTIVITAMIN WITH MINERALS) tablet Take 1 tablet by mouth daily.     rivaroxaban  (XARELTO ) 20 MG TABS tablet TAKE 1 TABLET(20 MG) BY MOUTH DAILY 30 tablet 0   No current facility-administered medications on file prior to visit.    BP 136/82   Pulse 75   Temp 98.5 F (36.9 C) (Oral)   Ht 6\' 1"  (1.854 m)   Wt 236 lb (107 kg)   SpO2 97%   BMI 31.14 kg/m       Objective:   Physical Exam Vitals and nursing note reviewed.  Constitutional:      Appearance: Normal appearance. He is obese.  Cardiovascular:     Rate and Rhythm: Normal rate and regular rhythm.     Pulses: Normal pulses.     Heart sounds: Normal heart sounds.  Pulmonary:     Effort: Pulmonary effort is normal.     Breath sounds: Normal breath sounds.  Skin:    General: Skin is warm and dry.  Neurological:     General: No focal deficit present.     Mental Status: He is alert and oriented to person, place, and time.  Psychiatric:        Mood and Affect: Mood normal.        Behavior: Behavior normal.        Thought Content: Thought content normal.        Judgment: Judgment normal.       Assessment & Plan:  1. Diabetes mellitus treated with oral medication (HCC) (Primary)  - POC HgB A1c- 6.9 - has improved drastically. Congratulated on lifestyle modifications  - Follow up in 3 months.  - empagliflozin  (JARDIANCE ) 25 MG TABS tablet; Take 1 tablet (25 mg total) by mouth daily before breakfast.  Dispense: 90 tablet; Refill: 0 - glipiZIDE  (GLUCOTROL  XL) 10 MG 24 hr tablet; Take 1 tablet (10 mg total) by mouth daily with breakfast.  Dispense: 90 tablet; Refill: 0 - metFORMIN  (GLUCOPHAGE ) 1000 MG tablet; TAKE 1 TABLET TWICE A DAY WITH A MEALTAKE 1 TABLET TWICE A DAY WITH A MEAL  Dispense: 180 tablet; Refill: 0  2. Essential hypertension - Well controlled. No change in medication   3. Acute pulmonary embolism, unspecified pulmonary embolism type, unspecified whether acute cor pulmonale present (HCC) - needs refill   - rivaroxaban  (XARELTO ) 20 MG TABS tablet; Take 1 tablet (20 mg total) by mouth daily with supper.  Dispense: 90 tablet; Refill: 3  Jomaira Darr, NP

## 2024-03-17 NOTE — Patient Instructions (Addendum)
 Your A1c has dropped to 6.9!  Keep working on it!  I will see you back in 3 months

## 2024-03-24 ENCOUNTER — Other Ambulatory Visit: Payer: Self-pay | Admitting: Adult Health

## 2024-03-24 DIAGNOSIS — E119 Type 2 diabetes mellitus without complications: Secondary | ICD-10-CM

## 2024-05-20 ENCOUNTER — Other Ambulatory Visit: Payer: Self-pay | Admitting: Adult Health

## 2024-05-20 DIAGNOSIS — E119 Type 2 diabetes mellitus without complications: Secondary | ICD-10-CM

## 2024-05-20 MED ORDER — EMPAGLIFLOZIN 25 MG PO TABS: 25.0000 mg | ORAL_TABLET | Freq: Every day | ORAL | 0 refills | Status: DC

## 2024-05-20 MED ORDER — GLIPIZIDE ER 10 MG PO TB24: 10.0000 mg | ORAL_TABLET | Freq: Every day | ORAL | 0 refills | Status: DC

## 2024-05-20 NOTE — Telephone Encounter (Signed)
 Copied from CRM [id number]. Topic: Clinical - Medication Refill >> May 20, 2024  2:18 PM Savanna F wrote: Medication:  empagliflozin  (JARDIANCE ) 25 MG TABS tablet glipiZIDE  (GLUCOTROL  XL) 10 MG 24 hr tablet  Patient saw Darleene on 5/28 but the refill still looks to be pending and he is showing no refills, but it should have multiple refills for both of these medications. He has been out of the glipizide  for a couple of weeks now and he had to pay full price for the jardiance .   Has the patient contacted their pharmacy? Yes (Agent: If no, request that the patient contact the pharmacy for the refill. If patient does not wish to contact the pharmacy document the reason why and proceed with request.) (Agent: If yes, when and what did the pharmacy advise?)  This is the patient's preferred pharmacy:   [facility] - [place], [place] - [address] [facility] [address] [place] [address] Phone: [phone] Fax: [phone]  Is this the correct pharmacy for this prescription? Yes If no, delete pharmacy and type the correct one.   Has the prescription been filled recently? No  Is the patient out of the medication? Yes  Has the patient been seen for an appointment in the last year OR does the patient have an upcoming appointment? Yes  Can we respond through MyChart? Yes  Agent: Please be advised that Rx refills may take up to 3 business days. We ask that you follow-up with your pharmacy.

## 2024-05-22 DIAGNOSIS — J3489 Other specified disorders of nose and nasal sinuses: Secondary | ICD-10-CM | POA: Diagnosis not present

## 2024-05-22 DIAGNOSIS — R42 Dizziness and giddiness: Secondary | ICD-10-CM | POA: Diagnosis not present

## 2024-05-22 NOTE — Progress Notes (Signed)
 Subjective: Patient ID:  Joshua Castaneda is a 58 y.o. male  Chief Complaint  Patient presents with  . Dizziness    Started yesterday When he lays down on left side he said the world starts turning Left nasal sinus pressure No home meds    The following information was reviewed by members of the visit team:  Tobacco  Allergies  Meds  Problems  Med Hx  Surg Hx  OB Status   Fam Hx    58 year old male presents for evaluation of symptoms ongoing 2 days.  He reports he has been having dizziness with laying on his left side or turning his head to the left when laying down.  He states he feels normally when he is standing, sitting, walking, actually doing anything other than laying down.  He has also had some left maxillary sinus pressure and clear drainage from the left nostril.  He denies any fall or head injury prior to symptoms starting.  He also denies any headache, vision change, chest pain, palpitations, nausea, or other concerns.  He has had similar symptoms in the past with sinus infections.   He does not appear to be in distress during exam and does not have an altered gait.  Dizziness Associated symptoms include congestion. Pertinent negatives include no abdominal pain, chest pain, chills, fever, headaches, nausea or vomiting.     Review of Systems  Constitutional:  Negative for chills and fever.  HENT:  Positive for congestion and sinus pressure.   Eyes:  Negative for visual disturbance.  Respiratory:  Negative for shortness of breath.   Cardiovascular:  Negative for chest pain.  Gastrointestinal:  Negative for abdominal pain, diarrhea, nausea and vomiting.  Genitourinary:  Negative for dysuria.  Neurological:  Positive for dizziness. Negative for headaches.      Objective  Vitals:   05/22/24 0908 05/22/24 0909  BP: (!) 160/94 (!) 156/91  Pulse: 79   Resp: 16   Temp: 97.3 F (36.3 C)   TempSrc: Tympanic   SpO2: 100%   Weight: 103 kg (228 lb)   Height:  1.981 m (6' 6)     No LMP for male patient.   Physical Exam Vitals and nursing note reviewed.  Constitutional:      Appearance: Normal appearance. He is normal weight.  HENT:     Right Ear: Ear canal and external ear normal. A middle ear effusion is present.     Left Ear: Tympanic membrane, ear canal and external ear normal.     Nose:     Right Turbinates: Enlarged.     Left Turbinates: Enlarged.     Right Sinus: No maxillary sinus tenderness or frontal sinus tenderness.     Left Sinus: No maxillary sinus tenderness or frontal sinus tenderness.     Mouth/Throat:     Mouth: Mucous membranes are moist.   Eyes:     Extraocular Movements: Extraocular movements intact.     Conjunctiva/sclera: Conjunctivae normal.     Pupils: Pupils are equal, round, and reactive to light.    Cardiovascular:     Rate and Rhythm: Normal rate and regular rhythm.     Heart sounds: Normal heart sounds.  Pulmonary:     Effort: Pulmonary effort is normal.     Breath sounds: Normal breath sounds.   Musculoskeletal:     Cervical back: Normal range of motion and neck supple.  Lymphadenopathy:     Cervical: No cervical adenopathy.   Skin:  General: Skin is warm and dry.     Capillary Refill: Capillary refill takes less than 2 seconds.   Neurological:     General: No focal deficit present.     Mental Status: He is alert and oriented to person, place, and time.     Gait: Gait normal.   Psychiatric:        Mood and Affect: Mood normal.        Behavior: Behavior normal.      No results found for this or any previous visit (from the past 24 hours).   Radiologist interpretation:   No orders to display     Assessment/Plan   Aboubacar was seen today for dizziness.  Diagnoses and all orders for this visit:  Vertigo -     meclizine (ANTIVERT) 25 mg tablet; Take one half tablet by mouth every 4-6 hours as needed for vertigo  Sinus pressure -     fluticasone propionate (FLONASE) 50  mcg/spray nasal spray; Administer 1 spray into each nostril daily. -     guaiFENesin (MUCINEX) 600 mg 12 hr tablet; Take 1 tablet (600 mg total) by mouth 2 (two) times a day.    Patient has been instructed on RX/ OTC medications, dosages, side effects, and possible interactions as associated with each diagnosis in my impression and plan above.  2.   Patient education (verbal/handout) given on diagnosis, pathophysiology, treatment of diagnosis, side effects of medication use for treatment, restrictions while taking medication.  Supportive       Care measures as directed on AVS.  Red Flags associated with diagnosis/es were reviewed and patient instructed on action plan if red flags develop.  3.   Urgent Care Disposition:  Follow up with PCP       They have been instructed that if symptoms worse that should go to Urgent Care, go to the nearest ED, or activate EMS.  4.   Patient agreed with plan and voiced understanding.  NO barriers to adherence perceived by myself.  Electronically signed: Rosaline Jama Collet FNP  Dju 05/22/2024 9:33 AM

## 2024-06-23 ENCOUNTER — Ambulatory Visit: Admitting: Adult Health

## 2024-06-23 ENCOUNTER — Encounter: Payer: Self-pay | Admitting: Adult Health

## 2024-06-23 VITALS — BP 120/70 | HR 91 | Temp 98.4°F | Ht 73.0 in | Wt 230.0 lb

## 2024-06-23 DIAGNOSIS — I10 Essential (primary) hypertension: Secondary | ICD-10-CM | POA: Diagnosis not present

## 2024-06-23 DIAGNOSIS — E119 Type 2 diabetes mellitus without complications: Secondary | ICD-10-CM | POA: Diagnosis not present

## 2024-06-23 DIAGNOSIS — Z7984 Long term (current) use of oral hypoglycemic drugs: Secondary | ICD-10-CM | POA: Diagnosis not present

## 2024-06-23 LAB — POCT GLYCOSYLATED HEMOGLOBIN (HGB A1C): Hemoglobin A1C: 8.6 % — AB (ref 4.0–5.6)

## 2024-06-23 MED ORDER — METFORMIN HCL 1000 MG PO TABS: ORAL_TABLET | ORAL | 0 refills | Status: DC

## 2024-06-23 MED ORDER — GLIPIZIDE ER 10 MG PO TB24: 10.0000 mg | ORAL_TABLET | Freq: Every day | ORAL | 0 refills | Status: DC

## 2024-06-23 MED ORDER — EMPAGLIFLOZIN 25 MG PO TABS: 25.0000 mg | ORAL_TABLET | Freq: Every day | ORAL | 0 refills | Status: DC

## 2024-06-23 NOTE — Progress Notes (Signed)
 Subjective:    Patient ID: Joshua Castaneda, male    DOB: [birth year], 58 y.o.   MRN: [id number]  HPI 58 year old male who  has a past medical history of Diabetes type 2, controlled (HCC), DVT (deep venous thrombosis) (HCC), Hematuria, Hypertension, Metabolic syndrome, and Pulmonary embolism (HCC).  He presents to the office today for follow up regarding DM and HTN   Diabetes mellitus type 2- he is currently managed with Glipizide  10 mg ER daily, Jardiance  25 mg daily, and metformin  1000 mg twice daily. He reports today that he has been working on diet and exercise. He has not been checking his BS at home. Reports that he had issues with insurance dealing with Jardiance  and Glipizide  and was out of it for about a month but was able to restart this in mid June. He has been walking most days.   Lab Results  Component Value Date   HGBA1C 8.6 (A) 06/23/2024   HGBA1C 6.9 (A) 03/17/2024   HGBA1C 11.3 (H) 12/17/2023   Hypertension -managed with Norvasc  5 mg daily, lisinopril /hydrochlorothiazide  20-12.5 mg daily.  He denies dizziness, lightheadedness, chest pain, or syncopal episodes.  He does not check his blood pressure at home BP Readings from Last 3 Encounters:  06/23/24 120/70  03/17/24 136/82  12/17/23 130/80    Review of Systems See HPI   Past Medical History:  Diagnosis Date   Diabetes type 2, controlled (HCC)    DVT (deep venous thrombosis) (HCC)    had LLE dvt in 1997; no clear provocation.    Hematuria    neg work up with Urology.    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   Tobacco Use   Smoking status: Never   Smokeless tobacco:  Never  Substance and Sexual Activity   Alcohol use: Yes    Comment: occasional wine   Drug use: No   Sexual activity: Not on file  Other Topics Concern   Not on file  Social History Narrative   ** Merged History Encounter ** w[employer]ks with [employer] [place]  Has wife and kids live in [place]    Social Drivers of Health   Financial Resource Strain: Not on file  Food Insecurity: Not on file  Transportation Needs: Not on file  Physical Activity: Not on file  Stress: Not on file  Social Connections: Not on file  Intimate Partner Violence: Not on file    Past Surgical History:  Procedure Laterality Date   NO PAST SURGERIES      Family History  Problem Relation Age of Onset   Heart failure Father    Clotting disorder Father    Stroke Mother    Aneurysm Mother        brain   Hypertension Sister    Diabetes Sister    Hypertension Brother    Diabetes Brother    Colon cancer Neg Hx    Esophageal cancer Neg Hx    Rectal cancer Neg Hx     Allergies  Allergen Reactions   Shellfish Allergy  REACTION: throat swells   Shrimp Flavor Agent (Non-Screening)     REACTION: throat swells    Current Outpatient Medications on File Prior to Visit  Medication Sig Dispense Refill   amLODipine  (NORVASC ) 5 MG tablet Take 1 tablet (5 mg total) by mouth daily. 90 tablet 3   atorvastatin  (LIPITOR) 40 MG tablet Take 1 tablet (40 mg total) by mouth daily. 90 tablet 3   empagliflozin  (JARDIANCE ) 25 MG TABS tablet Take 1 tablet (25 mg total) by mouth daily before breakfast. 90 tablet 0   glipiZIDE  (GLUCOTROL  XL) 10 MG 24 hr tablet Take 1 tablet (10 mg total) by mouth daily with breakfast. 90 tablet 0   glucose blood (ACCU-CHEK AVIVA PLUS) test strip Use as instructed 100 each 12   lisinopril -hydrochlorothiazide  (ZESTORETIC ) 20-12.5 MG tablet Take 1 tablet by mouth daily. 90 tablet 3   metFORMIN  (GLUCOPHAGE ) 1000 MG tablet TAKE 1 TABLET TWICE A DAY WITH A MEALTAKE 1  TABLET TWICE A DAY WITH A MEAL 180 tablet 0   Multiple Vitamins-Minerals (MULTIVITAMIN WITH MINERALS) tablet Take 1 tablet by mouth daily.     rivaroxaban  (XARELTO ) 20 MG TABS tablet Take 1 tablet (20 mg total) by mouth daily with supper. 90 tablet 3   No current facility-administered medications on file prior to visit.    BP 120/70   Pulse 91   Temp 98.4 F (36.9 C) (Oral)   Ht 6' 1 (1.854 m)   Wt 230 lb (104.3 kg)   SpO2 97%   BMI 30.34 kg/m       Objective:   Physical Exam Vitals and nursing note reviewed.  Constitutional:      Appearance: Normal appearance. He is obese.  Cardiovascular:     Rate and Rhythm: Normal rate and regular rhythm.     Pulses: Normal pulses.     Heart sounds: Normal heart sounds.  Pulmonary:     Effort: Pulmonary effort is normal.     Breath sounds: Normal breath sounds.  Skin:    General: Skin is warm and dry.  Neurological:     General: No focal deficit present.     Mental Status: He is alert and oriented to person, place, and time.  Psychiatric:        Mood and Affect: Mood normal.        Behavior: Behavior normal.        Thought Content: Thought content normal.        Judgment: Judgment normal.        Assessment & Plan:  1. Diabetes mellitus treated with oral medication (HCC) (Primary)  - POC HgB A1c- 8.6  - Discussed placing on Ozempic but he would like three months to get back on track.  - empagliflozin  (JARDIANCE ) 25 MG TABS tablet; Take 1 tablet (25 mg total) by mouth daily before breakfast.  Dispense: 90 tablet; Refill: 0 - glipiZIDE  (GLUCOTROL  XL) 10 MG 24 hr tablet; Take 1 tablet (10 mg total) by mouth daily with breakfast.  Dispense: 90 tablet; Refill: 0 - metFORMIN  (GLUCOPHAGE ) 1000 MG tablet; TAKE 1 TABLET TWICE A DAY WITH A MEALTAKE 1 TABLET TWICE A DAY WITH A MEAL  Dispense: 180 tablet; Refill: 0  2. Essential hypertension - At goal. No change in medication    Darleene Shape, NP

## 2024-06-23 NOTE — Patient Instructions (Signed)
 Health Maintenance Due  Topic Date Due   OPHTHALMOLOGY EXAM  Never done   Hepatitis B Vaccines 19-59 Average Risk (1 of 3 - 19+ 3-dose series) Never done   INFLUENZA VACCINE  05/21/2024   COVID-19 Vaccine (6 - 2025-26 season) 06/21/2024       12/17/2023    8:18 AM 07/16/2022    7:56 AM 12/01/2020    7:07 AM  Depression screen PHQ 2/9  Decreased Interest 0 0 0  Down, Depressed, Hopeless 0 0 0  PHQ - 2 Score 0 0 0

## 2024-08-10 ENCOUNTER — Telehealth: Payer: Self-pay

## 2024-08-10 NOTE — Telephone Encounter (Signed)
 Patient was identified as falling into the [place] Measure - Diabetes.   Patient was: Appointment already scheduled for:  09/22/2024 with Darleene Lory.

## 2024-09-22 ENCOUNTER — Ambulatory Visit: Admitting: Adult Health

## 2024-09-22 VITALS — BP 130/80 | HR 83 | Temp 98.0°F | Ht 73.0 in | Wt 231.0 lb

## 2024-09-22 DIAGNOSIS — Z23 Encounter for immunization: Secondary | ICD-10-CM | POA: Diagnosis not present

## 2024-09-22 DIAGNOSIS — I10 Essential (primary) hypertension: Secondary | ICD-10-CM | POA: Diagnosis not present

## 2024-09-22 DIAGNOSIS — Z7984 Long term (current) use of oral hypoglycemic drugs: Secondary | ICD-10-CM | POA: Diagnosis not present

## 2024-09-22 DIAGNOSIS — E119 Type 2 diabetes mellitus without complications: Secondary | ICD-10-CM | POA: Diagnosis not present

## 2024-09-22 LAB — POCT GLYCOSYLATED HEMOGLOBIN (HGB A1C): Hemoglobin A1C: 7.9 % — AB (ref 4.0–5.6)

## 2024-09-22 MED ORDER — METFORMIN HCL 1000 MG PO TABS: ORAL_TABLET | ORAL | 1 refills | Status: AC

## 2024-09-22 MED ORDER — GLIPIZIDE ER 10 MG PO TB24: 10.0000 mg | ORAL_TABLET | Freq: Every day | ORAL | 1 refills | Status: AC

## 2024-09-22 MED ORDER — EMPAGLIFLOZIN 25 MG PO TABS: 25.0000 mg | ORAL_TABLET | Freq: Every day | ORAL | 1 refills | Status: AC

## 2024-09-22 NOTE — Patient Instructions (Signed)
 Health Maintenance Due  Topic Date Due   OPHTHALMOLOGY EXAM  Never done   Hepatitis B Vaccines 19-59 Average Risk (1 of 3 - 19+ 3-dose series) Never done   Influenza Vaccine  05/21/2024   COVID-19 Vaccine (6 - 2025-26 season) 06/21/2024       12/17/2023    8:18 AM 07/16/2022    7:56 AM 12/01/2020    7:07 AM  Depression screen PHQ 2/9  Decreased Interest 0 0 0  Down, Depressed, Hopeless 0 0 0  PHQ - 2 Score 0 0 0

## 2024-09-22 NOTE — Progress Notes (Signed)
 Subjective:    Patient ID: Joshua Castaneda, male    DOB: 09-Mar-1966, 58 y.o.   MRN: [id number]  HPI 58 year old male who  has a past medical history of Diabetes type 2, controlled (HCC), DVT (deep venous thrombosis) (HCC), Hematuria, Hypertension, Metabolic syndrome, and Pulmonary embolism (HCC).  He presents to the office today for follow up regarding DM and HTN   Diabetes mellitus type 2- he is currently managed with Glipizide  10 mg ER daily, Jardiance  25 mg daily, and metformin  1000 mg twice daily. He reports today that he has not been able to exercise much except on the weekends. He has not been checking his BS at home.  Lab Results  Component Value Date   HGBA1C 8.6 (A) 06/23/2024   HGBA1C 6.9 (A) 03/17/2024   HGBA1C 11.3 (H) 12/17/2023   Wt Readings from Last 3 Encounters:  09/22/24 231 lb (104.8 kg)  06/23/24 230 lb (104.3 kg)  03/17/24 236 lb (107 kg)   Hypertension -managed with Norvasc  5 mg daily, lisinopril /hydrochlorothiazide  20-12.5 mg daily.  He denies dizziness, lightheadedness, chest pain, or syncopal episodes.  He does not check his blood pressure at home BP Readings from Last 3 Encounters:  09/22/24 130/80  06/23/24 120/70  03/17/24 136/82    Review of Systems See HPI   Past Medical History:  Diagnosis Date   Diabetes type 2, controlled (HCC)    DVT (deep venous thrombosis) (HCC)    had LLE dvt in 1997; no clear provocation.    Hematuria    neg work up with Urology.    Hypertension    Metabolic syndrome    Pulmonary embolism (HCC)     Social History   Socioeconomic History   Marital status: Married    Spouse name: Not on file   Number of children: 1   Years of education: Not on file   Highest education level: Not on file  Occupational History   Occupation: Naval Architect: [employer]    Comment: active the field.   Tobacco Use   Smoking status: Never   Smokeless tobacco: Never  Substance and Sexual Activity   Alcohol  use: Yes    Comment: occasional wine   Drug use: No   Sexual activity: Not on file  Other Topics Concern   Not on file  Social History Narrative   ** Merged History Encounter ** [employer]ks with [employer] [place]  Has wife and kids live in [place]    Social Drivers of Health   Financial Resource Strain: Not on file  Food Insecurity: Not on file  Transportation Needs: Not on file  Physical Activity: Not on file  Stress: Not on file  Social Connections: Not on file  Intimate Partner Violence: Not on file    Past Surgical History:  Procedure Laterality Date   NO PAST SURGERIES      Family History  Problem Relation Age of Onset   Heart failure Father    Clotting disorder Father    Stroke Mother    Aneurysm Mother        brain   Hypertension Sister    Diabetes Sister    Hypertension Brother    Diabetes Brother    Colon cancer Neg Hx    Esophageal cancer Neg Hx    Rectal cancer Neg Hx     Allergies  Allergen Reactions   Shellfish Allergy     REACTION: throat swells  Shrimp Flavor Agent (Non-Screening)     REACTION: throat swells    Current Outpatient Medications on File Prior to Visit  Medication Sig Dispense Refill   amLODipine  (NORVASC ) 5 MG tablet Take 1 tablet (5 mg total) by mouth daily. 90 tablet 3   empagliflozin  (JARDIANCE ) 25 MG TABS tablet Take 1 tablet (25 mg total) by mouth daily before breakfast. 90 tablet 0   fluticasone (FLONASE) 50 MCG/ACT nasal spray Place 1 spray into the nose.     glipiZIDE  (GLUCOTROL  XL) 10 MG 24 hr tablet Take 1 tablet (10 mg total) by mouth daily with breakfast. 90 tablet 0   glucose blood (ACCU-CHEK AVIVA PLUS) test strip Use as instructed 100 each 12   lisinopril -hydrochlorothiazide  (ZESTORETIC ) 20-12.5 MG tablet Take 1 tablet by mouth daily. 90 tablet 3   metFORMIN  (GLUCOPHAGE ) 1000 MG tablet TAKE 1 TABLET TWICE A DAY WITH A MEALTAKE 1 TABLET TWICE A DAY WITH A MEAL 180 tablet 0   Multiple  Vitamins-Minerals (MULTIVITAMIN WITH MINERALS) tablet Take 1 tablet by mouth daily.     rivaroxaban  (XARELTO ) 20 MG TABS tablet Take 1 tablet (20 mg total) by mouth daily with supper. 90 tablet 3   atorvastatin  (LIPITOR) 40 MG tablet Take 1 tablet (40 mg total) by mouth daily. (Patient not taking: Reported on 09/22/2024) 90 tablet 3   No current facility-administered medications on file prior to visit.    BP 130/80   Pulse 83   Temp 98 F (36.7 C) (Oral)   Ht 6' 1 (1.854 m)   Wt 231 lb (104.8 kg)   SpO2 98%   BMI 30.48 kg/m       Objective:   Physical Exam Vitals and nursing note reviewed.  Constitutional:      Appearance: Normal appearance.  Cardiovascular:     Rate and Rhythm: Normal rate and regular rhythm.     Pulses: Normal pulses.     Heart sounds: Normal heart sounds.  Pulmonary:     Effort: Pulmonary effort is normal.     Breath sounds: Normal breath sounds.  Skin:    General: Skin is warm and dry.  Neurological:     General: No focal deficit present.     Mental Status: He is alert and oriented to person, place, and time.  Psychiatric:        Mood and Affect: Mood normal.        Behavior: Behavior normal.        Thought Content: Thought content normal.        Judgment: Judgment normal.        Assessment & Plan:  1. Diabetes mellitus treated with oral medication (HCC) (Primary)  - POC HgB A1c- 7.9 - has improved but not at goal.  - We discussed GLP1 therapy again and he would like to hold off and keep his medication regimen the same. He understands long term risk of uncontrolled DM  - Follow up in 3 months for CPE  - empagliflozin  (JARDIANCE ) 25 MG TABS tablet; Take 1 tablet (25 mg total) by mouth daily before breakfast.  Dispense: 90 tablet; Refill: 1 - glipiZIDE  (GLUCOTROL  XL) 10 MG 24 hr tablet; Take 1 tablet (10 mg total) by mouth daily with breakfast.  Dispense: 90 tablet; Refill: 1 - metFORMIN  (GLUCOPHAGE ) 1000 MG tablet; TAKE 1 TABLET TWICE A DAY  WITH A MEALTAKE 1 TABLET TWICE A DAY WITH A MEAL  Dispense: 180 tablet; Refill: 1  2. Essential hypertension - Controlled. No  change in medication   Darleene Shape, NP

## 2024-10-29 ENCOUNTER — Other Ambulatory Visit: Payer: Self-pay

## 2024-10-29 ENCOUNTER — Telehealth: Payer: Self-pay | Admitting: Adult Health

## 2024-10-29 ENCOUNTER — Telehealth: Payer: Self-pay | Admitting: *Deleted

## 2024-10-29 DIAGNOSIS — I2699 Other pulmonary embolism without acute cor pulmonale: Secondary | ICD-10-CM

## 2024-10-29 MED ORDER — RIVAROXABAN 20 MG PO TABS: 20.0000 mg | ORAL_TABLET | Freq: Every day | ORAL | 0 refills | Status: AC

## 2024-10-29 NOTE — Telephone Encounter (Signed)
 Copied from CRM [id number]. Topic: Clinical - Medication Refill >> Oct 29, 2024 12:25 PM Leah C wrote: Medication: rivaroxaban  (XARELTO ) 20 MG TABS tablet. Took last pill this morning and Patient needs a ten day supply. He is usually mail order but he would like to [facility] [address] [facility] up and would like to receive a call from the pharmacist.    Has the patient contacted their pharmacy? Yes (Agent: If no, request that the patient contact the pharmacy for the refill. If patient does not wish to contact the pharmacy document the reason why and proceed with request.) (Agent: If yes, when and what did the pharmacy advise?)  This is the patient's preferred pharmacy:  [id number] [place], [place] - [address] [place] [address] Phone: [phone] Fax: [phone]   Is this the correct pharmacy for this prescription? Yes If no, delete pharmacy and type the correct one.   Has the prescription been filled recently? Yes Is the patient out of the medication? Yes  Has the patient been seen for an appointment in the last year OR does the patient have an upcoming appointment? Yes  Can we respond through MyChart? Yes  Agent: Please be advised that Rx refills may take up to 3 business days. We ask that you follow-up with your pharmacy.

## 2024-10-29 NOTE — Telephone Encounter (Signed)
 Copied from CRM [id number]. Topic: Clinical - Prescription Issue >> Oct 29, 2024  2:25 PM Wess RAMAN wrote: Reason for CRM: Patient is out of rivaroxaban  (XARELTO ) 20 MG TABS tablet and would like a temporary refill sent to local pharmacy until mail order arrives  [place] #: [id number]  Pharmacy: [address] [place] [address] Phone: [phone] Fax: [phone] Hours: Not open 24 hours

## 2024-10-29 NOTE — Telephone Encounter (Signed)
 rivaroxaban  (XARELTO ) 20 MG TABS tablet. Took last pill this morning and Patient needs a ten day supply. He is usually mail order but he would like to [facility] [address] [facility] and would like to receive a call from the pharmacist.

## 2024-10-29 NOTE — Telephone Encounter (Signed)
 Call mail order and they stated that pt insurance is not matching up. I sent in [facility]. Pt notified of update.

## 2024-12-21 ENCOUNTER — Ambulatory Visit: Admitting: Adult Health
